# Patient Record
Sex: Male | Born: 1990 | Hispanic: Yes | Marital: Single | Smoking: Never smoker
Health system: Southern US, Community
[De-identification: ages and names within clinical notes are randomized; demographics above are authoritative.]

## PROBLEM LIST (undated history)

## (undated) DIAGNOSIS — Q179 Congenital malformation of ear, unspecified: Secondary | ICD-10-CM

---

## 2010-07-25 ENCOUNTER — Emergency Department (HOSPITAL_COMMUNITY)
Admission: EM | Admit: 2010-07-25 | Discharge: 2010-07-26 | Disposition: A | Discharge status: Home / Self Care | Payer: Self-pay | Attending: Emergency Medicine | Admitting: Emergency Medicine

## 2010-07-25 ENCOUNTER — Emergency Department (HOSPITAL_COMMUNITY): Payer: Self-pay

## 2010-07-25 DIAGNOSIS — S62639A Displaced fracture of distal phalanx of unspecified finger, initial encounter for closed fracture: Secondary | ICD-10-CM | POA: Insufficient documentation

## 2010-07-25 DIAGNOSIS — Y92009 Unspecified place in unspecified non-institutional (private) residence as the place of occurrence of the external cause: Secondary | ICD-10-CM | POA: Insufficient documentation

## 2010-07-25 DIAGNOSIS — M79609 Pain in unspecified limb: Secondary | ICD-10-CM | POA: Insufficient documentation

## 2010-07-25 DIAGNOSIS — S61209A Unspecified open wound of unspecified finger without damage to nail, initial encounter: Secondary | ICD-10-CM | POA: Insufficient documentation

## 2010-07-25 DIAGNOSIS — W298XXA Contact with other powered powered hand tools and household machinery, initial encounter: Secondary | ICD-10-CM | POA: Insufficient documentation

## 2011-11-02 IMAGING — CR DG FINGER RING 2+V*L*
3 series · 3 of 3 positions shown · non-contrast
Comparison: None.

CLINICAL DATA: Drill through third and fourth digits distally.

LEFT RING FINGER 2+V

[x finger pa left]
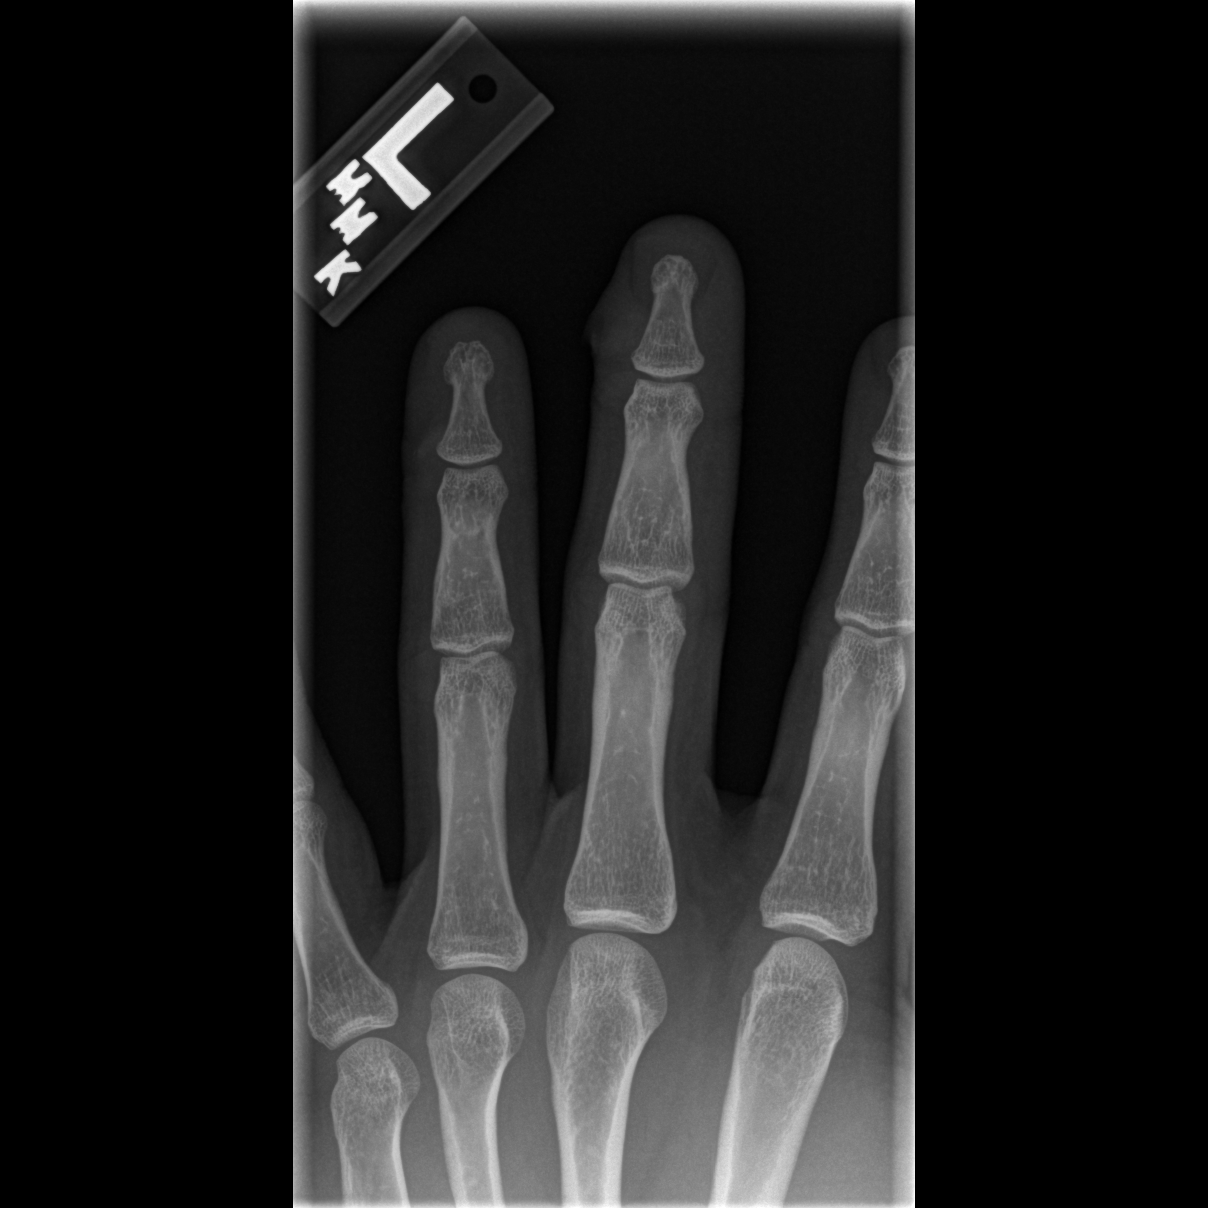

[x finger obl. left]
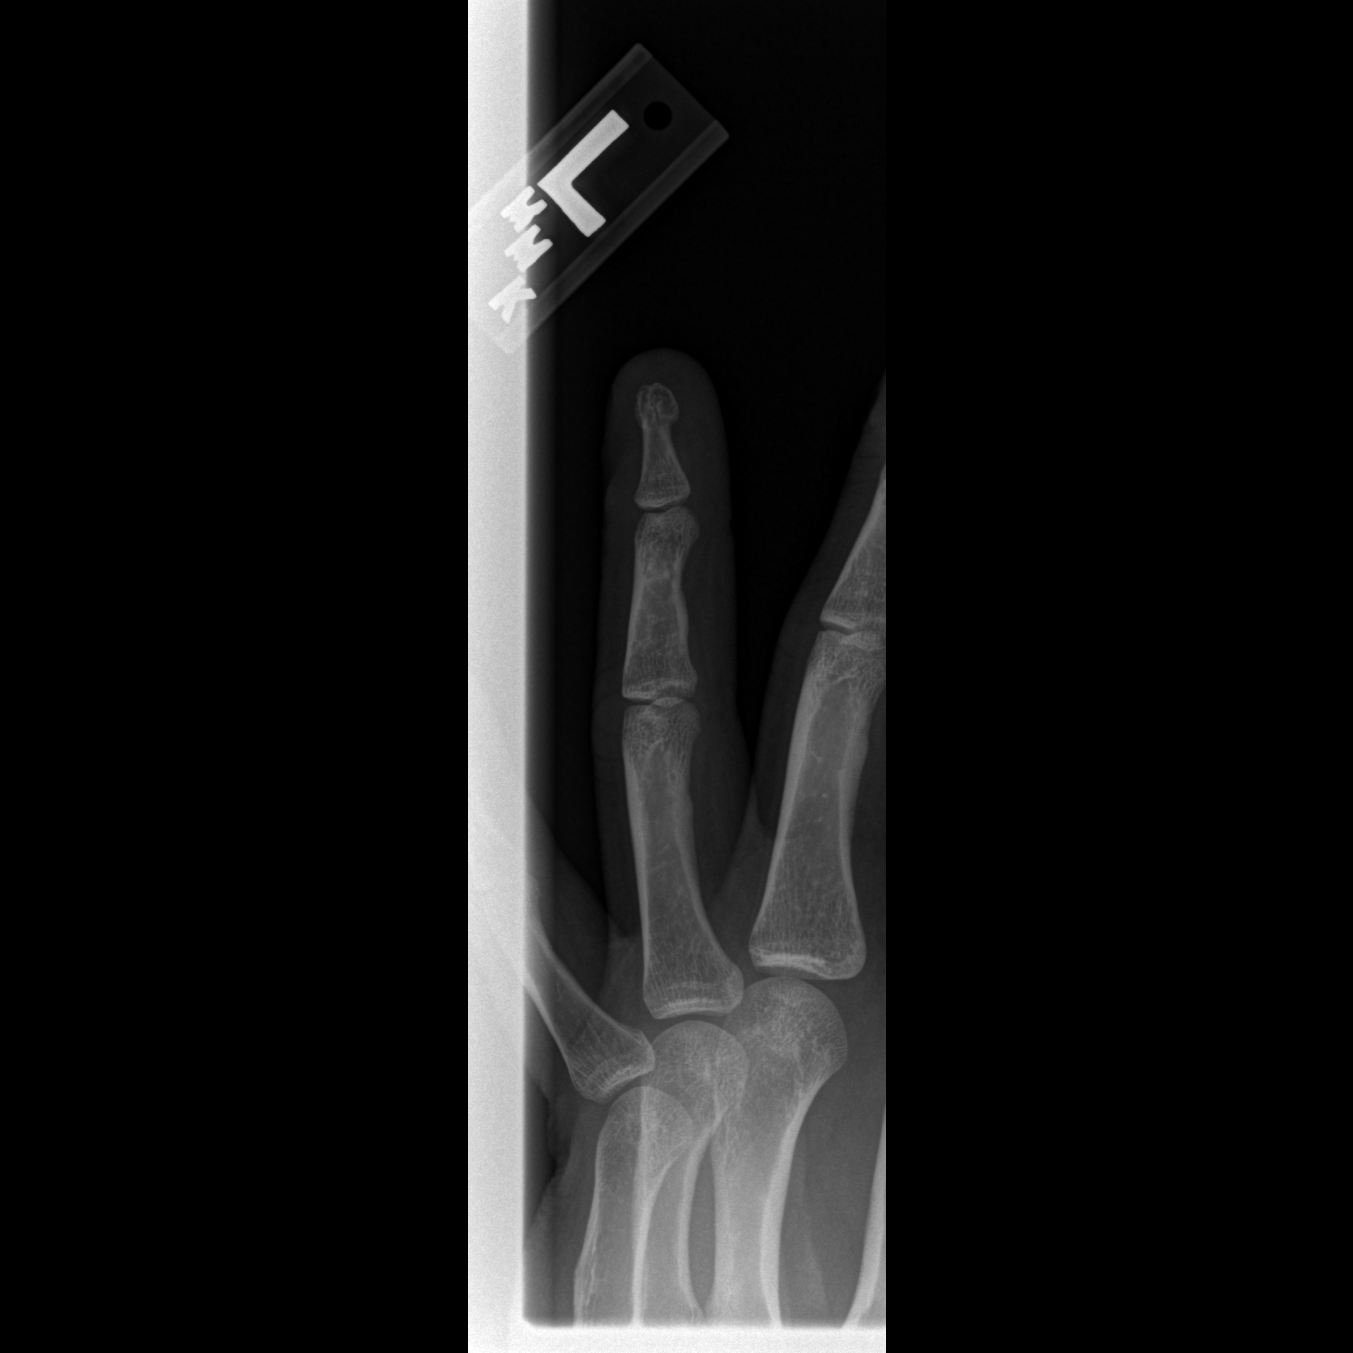

[x finger lateral left]
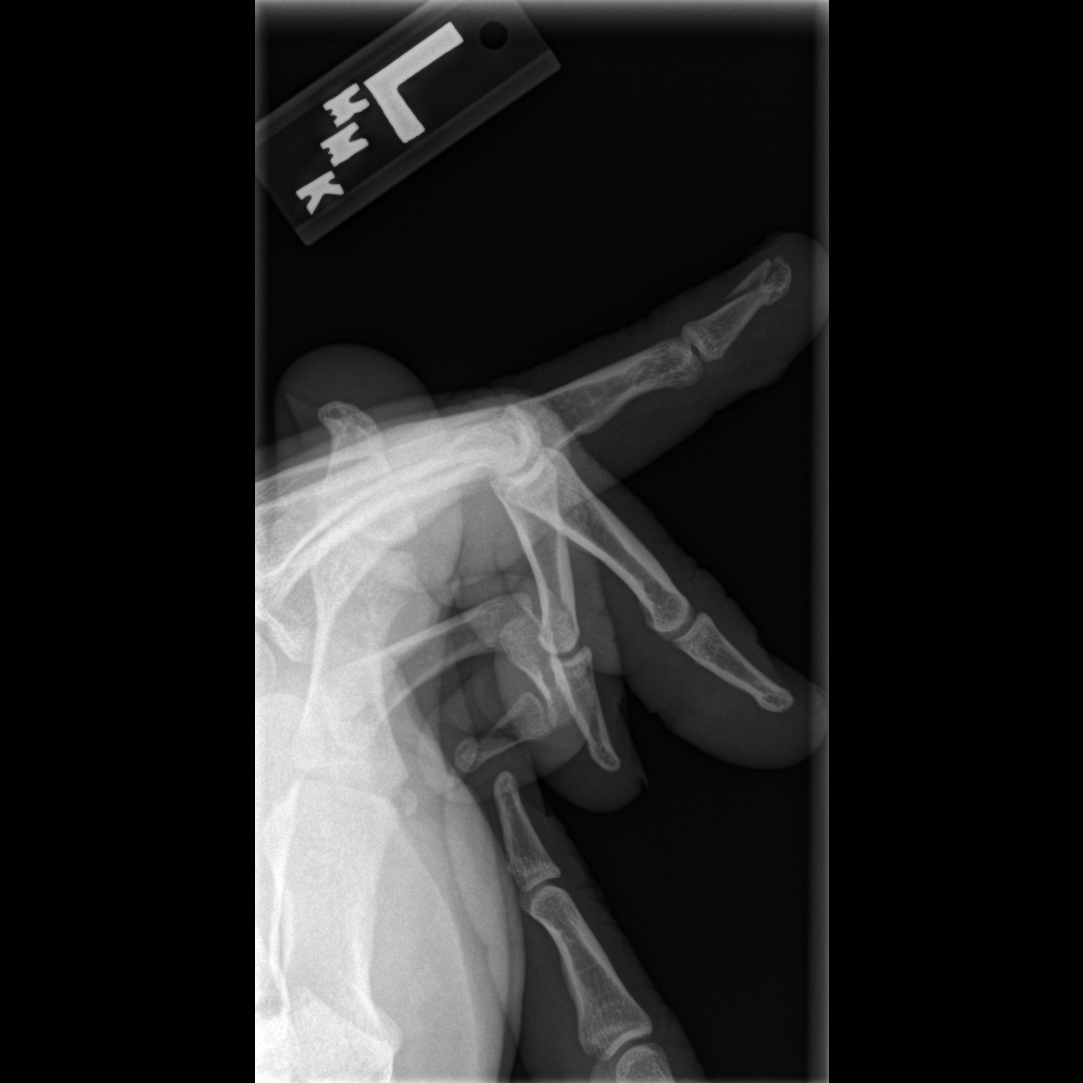

[3 of 3 positions shown; findings below may reference images not displayed]

FINDINGS: 3 views centered about the ring finger of the left hand.
Soft tissue injury is identified about the ulnar side of the third
digit distally.  No radiopaque foreign object.  A fracture is
identified about the tuft of the fourth digit posteriorly.  The
fracture is primarily in the coronal plane with minimal
comminution.  No intra-articular extension.
IMPRESSION: Tuft fracture of the fourth digit with adjacent soft tissue
irregularity.  Mild comminution.

## 2021-10-01 ENCOUNTER — Inpatient Hospital Stay (HOSPITAL_COMMUNITY)
Admission: EM | Admit: 2021-10-01 | Discharge: 2021-10-06 | DRG: 683 | Disposition: A | Discharge status: Home / Self Care | Payer: Self-pay | Attending: Internal Medicine | Admitting: Internal Medicine

## 2021-10-01 ENCOUNTER — Emergency Department (HOSPITAL_COMMUNITY): Payer: Self-pay

## 2021-10-01 ENCOUNTER — Encounter (HOSPITAL_COMMUNITY): Payer: Self-pay | Admitting: Internal Medicine

## 2021-10-01 ENCOUNTER — Other Ambulatory Visit: Payer: Self-pay

## 2021-10-01 DIAGNOSIS — Z6826 Body mass index (BMI) 26.0-26.9, adult: Secondary | ICD-10-CM

## 2021-10-01 DIAGNOSIS — Z20822 Contact with and (suspected) exposure to covid-19: Secondary | ICD-10-CM | POA: Diagnosis present

## 2021-10-01 DIAGNOSIS — E872 Acidosis, unspecified: Secondary | ICD-10-CM | POA: Diagnosis present

## 2021-10-01 DIAGNOSIS — R739 Hyperglycemia, unspecified: Secondary | ICD-10-CM

## 2021-10-01 DIAGNOSIS — A419 Sepsis, unspecified organism: Secondary | ICD-10-CM

## 2021-10-01 DIAGNOSIS — E1165 Type 2 diabetes mellitus with hyperglycemia: Secondary | ICD-10-CM | POA: Diagnosis present

## 2021-10-01 DIAGNOSIS — E876 Hypokalemia: Secondary | ICD-10-CM | POA: Diagnosis present

## 2021-10-01 DIAGNOSIS — E8770 Fluid overload, unspecified: Secondary | ICD-10-CM | POA: Diagnosis not present

## 2021-10-01 DIAGNOSIS — K760 Fatty (change of) liver, not elsewhere classified: Secondary | ICD-10-CM | POA: Diagnosis present

## 2021-10-01 DIAGNOSIS — R7401 Elevation of levels of liver transaminase levels: Secondary | ICD-10-CM

## 2021-10-01 DIAGNOSIS — N179 Acute kidney failure, unspecified: Principal | ICD-10-CM

## 2021-10-01 DIAGNOSIS — E785 Hyperlipidemia, unspecified: Secondary | ICD-10-CM | POA: Diagnosis present

## 2021-10-01 DIAGNOSIS — F1010 Alcohol abuse, uncomplicated: Secondary | ICD-10-CM | POA: Diagnosis present

## 2021-10-01 DIAGNOSIS — E871 Hypo-osmolality and hyponatremia: Secondary | ICD-10-CM

## 2021-10-01 DIAGNOSIS — R1310 Dysphagia, unspecified: Secondary | ICD-10-CM | POA: Diagnosis not present

## 2021-10-01 DIAGNOSIS — D649 Anemia, unspecified: Secondary | ICD-10-CM | POA: Diagnosis present

## 2021-10-01 DIAGNOSIS — E861 Hypovolemia: Secondary | ICD-10-CM | POA: Diagnosis present

## 2021-10-01 DIAGNOSIS — E860 Dehydration: Secondary | ICD-10-CM | POA: Diagnosis present

## 2021-10-01 DIAGNOSIS — R6520 Severe sepsis without septic shock: Secondary | ICD-10-CM | POA: Diagnosis present

## 2021-10-01 DIAGNOSIS — E8729 Other acidosis: Secondary | ICD-10-CM | POA: Diagnosis present

## 2021-10-01 DIAGNOSIS — E669 Obesity, unspecified: Secondary | ICD-10-CM | POA: Diagnosis present

## 2021-10-01 DIAGNOSIS — R7989 Other specified abnormal findings of blood chemistry: Secondary | ICD-10-CM

## 2021-10-01 DIAGNOSIS — D696 Thrombocytopenia, unspecified: Secondary | ICD-10-CM | POA: Diagnosis present

## 2021-10-01 HISTORY — DX: Congenital malformation of ear, unspecified: Q17.9

## 2021-10-01 LAB — COMPREHENSIVE METABOLIC PANEL
ALT: 64 U/L — ABNORMAL HIGH (ref 0–44)
AST: 62 U/L — ABNORMAL HIGH (ref 15–41)
Albumin: 2.8 g/dL — ABNORMAL LOW (ref 3.5–5.0)
Alkaline Phosphatase: 67 U/L (ref 38–126)
Anion gap: 17 — ABNORMAL HIGH (ref 5–15)
BUN: 37 mg/dL — ABNORMAL HIGH (ref 6–20)
CO2: 18 mmol/L — ABNORMAL LOW (ref 22–32)
Calcium: 6.8 mg/dL — ABNORMAL LOW (ref 8.9–10.3)
Chloride: 79 mmol/L — ABNORMAL LOW (ref 98–111)
Creatinine, Ser: 1.52 mg/dL — ABNORMAL HIGH (ref 0.61–1.24)
GFR, Estimated: >60 mL/min (ref >60)
Glucose, Bld: 318 mg/dL — ABNORMAL HIGH (ref 70–99)
Potassium: 3.2 mmol/L — ABNORMAL LOW (ref 3.5–5.1)
Sodium: 114 mmol/L — CL (ref 135–145)
Total Bilirubin: 11.7 mg/dL — ABNORMAL HIGH (ref 0.3–1.2)
Total Protein: 6.5 g/dL (ref 6.5–8.1)

## 2021-10-01 LAB — URINALYSIS, ROUTINE W REFLEX MICROSCOPIC
Bilirubin Urine: NEGATIVE
Glucose, UA: 150 mg/dL — AB
Ketones, ur: NEGATIVE mg/dL
Nitrite: NEGATIVE
Protein, ur: NEGATIVE mg/dL
Specific Gravity, Urine: 1.012 (ref 1.005–1.030)
pH: 5 (ref 5.0–8.0)

## 2021-10-01 LAB — CBG MONITORING, ED
Glucose-Capillary: 328 mg/dL — ABNORMAL HIGH (ref 70–99)
Glucose-Capillary: 284 mg/dL — ABNORMAL HIGH (ref 70–99)

## 2021-10-01 LAB — CBC WITH DIFFERENTIAL/PLATELET
Abs Immature Granulocytes: 0.06 10*3/uL (ref 0.00–0.07)
Basophils Absolute: 0 10*3/uL (ref 0.0–0.1)
Basophils Relative: 0 %
Eosinophils Absolute: 0 10*3/uL (ref 0.0–0.5)
Eosinophils Relative: 0 %
HCT: 37.8 % — ABNORMAL LOW (ref 39.0–52.0)
Hemoglobin: 13.9 g/dL (ref 13.0–17.0)
Immature Granulocytes: 0 %
Lymphocytes Relative: 1 %
Lymphs Abs: 0.2 10*3/uL — ABNORMAL LOW (ref 0.7–4.0)
MCH: 27.7 pg (ref 26.0–34.0)
MCHC: 36.8 g/dL — ABNORMAL HIGH (ref 30.0–36.0)
MCV: 75.3 fL — ABNORMAL LOW (ref 80.0–100.0)
Monocytes Absolute: 0.3 10*3/uL (ref 0.1–1.0)
Monocytes Relative: 2 %
Neutro Abs: 14.6 10*3/uL — ABNORMAL HIGH (ref 1.7–7.7)
Neutrophils Relative %: 97 %
Platelets: UNDETERMINED 10*3/uL (ref 150–400)
RBC: 5.02 MIL/uL (ref 4.22–5.81)
RDW: 12.4 % (ref 11.5–15.5)
WBC: 15.3 10*3/uL — ABNORMAL HIGH (ref 4.0–10.5)
nRBC: 0.1 % (ref 0.0–0.2)

## 2021-10-01 LAB — BLOOD GAS, VENOUS
Acid-base deficit: 6.3 mmol/L — ABNORMAL HIGH (ref 0.0–2.0)
Bicarbonate: 18.1 mmol/L — ABNORMAL LOW (ref 20.0–28.0)
Drawn by: 54030
O2 Saturation: 76 %
Patient temperature: 37
pCO2, Ven: 32 mmHg — ABNORMAL LOW (ref 44–60)
pH, Ven: 7.36 (ref 7.25–7.43)
pO2, Ven: 44 mmHg (ref 32–45)

## 2021-10-01 LAB — LACTIC ACID, PLASMA
Lactic Acid, Venous: 4.3 mmol/L (ref 0.5–1.9)
Lactic Acid, Venous: 3.6 mmol/L (ref 0.5–1.9)

## 2021-10-01 LAB — PROTIME-INR
INR: 1.4 — ABNORMAL HIGH (ref 0.8–1.2)
Prothrombin Time: 16.7 seconds — ABNORMAL HIGH (ref 11.4–15.2)

## 2021-10-01 LAB — LIPASE, BLOOD: Lipase: 63 U/L — ABNORMAL HIGH (ref 11–51)

## 2021-10-01 LAB — BETA-HYDROXYBUTYRIC ACID: Beta-Hydroxybutyric Acid: 0.15 mmol/L (ref 0.05–0.27)

## 2021-10-01 MED ORDER — POTASSIUM CHLORIDE CRYS ER 20 MEQ PO TBCR
40.0000 meq | EXTENDED_RELEASE_TABLET | Freq: Once | ORAL | Status: AC
  Administered 2021-10-01: 40 meq via ORAL
  Filled 2021-10-01: qty 2

## 2021-10-01 MED ORDER — ACETAMINOPHEN 325 MG PO TABS
650.0000 mg | ORAL_TABLET | Freq: Four times a day (QID) | ORAL | Status: DC | PRN
  Administered 2021-10-02 (×2): 650 mg via ORAL
  Filled 2021-10-01 (×2): qty 2

## 2021-10-01 MED ORDER — ONDANSETRON 4 MG PO TBDP: 4.0000 mg | ORAL_TABLET | Freq: Once | ORAL | Status: AC

## 2021-10-01 MED ORDER — ONDANSETRON 4 MG PO TBDP
ORAL_TABLET | ORAL | Status: AC
  Administered 2021-10-01: 4 mg via ORAL
  Filled 2021-10-01: qty 1

## 2021-10-01 MED ORDER — INSULIN ASPART 100 UNIT/ML IJ SOLN
0.0000 [IU] | INTRAMUSCULAR | Status: DC
  Administered 2021-10-02 (×2): 3 [IU] via SUBCUTANEOUS
  Administered 2021-10-02 (×2): 2 [IU] via SUBCUTANEOUS
  Administered 2021-10-02 (×2): 3 [IU] via SUBCUTANEOUS
  Administered 2021-10-03: 2 [IU] via SUBCUTANEOUS
  Administered 2021-10-03: 3 [IU] via SUBCUTANEOUS
  Administered 2021-10-03 (×3): 2 [IU] via SUBCUTANEOUS
  Administered 2021-10-03: 3 [IU] via SUBCUTANEOUS
  Administered 2021-10-04: 1 [IU] via SUBCUTANEOUS
  Administered 2021-10-04 (×3): 2 [IU] via SUBCUTANEOUS
  Administered 2021-10-04: 3 [IU] via SUBCUTANEOUS
  Administered 2021-10-04: 2 [IU] via SUBCUTANEOUS
  Administered 2021-10-05: 1 [IU] via SUBCUTANEOUS
  Administered 2021-10-05: 2 [IU] via SUBCUTANEOUS
  Administered 2021-10-06: 1 [IU] via SUBCUTANEOUS

## 2021-10-01 MED ORDER — SODIUM CHLORIDE 0.9 % IV BOLUS (SEPSIS)
1000.0000 mL | Freq: Once | INTRAVENOUS | Status: AC
  Administered 2021-10-01: 1000 mL via INTRAVENOUS

## 2021-10-01 MED ORDER — SODIUM CHLORIDE 0.9 % IV SOLN: INTRAVENOUS | Status: DC

## 2021-10-01 MED ORDER — SODIUM CHLORIDE 0.9 % IV SOLN
2.0000 g | Freq: Once | INTRAVENOUS | Status: AC
  Administered 2021-10-01: 2 g via INTRAVENOUS
  Filled 2021-10-01: qty 12.5

## 2021-10-01 MED ORDER — SODIUM CHLORIDE 0.9 % IV BOLUS
1000.0000 mL | Freq: Once | INTRAVENOUS | Status: AC
  Administered 2021-10-01: 1000 mL via INTRAVENOUS

## 2021-10-01 MED ORDER — VANCOMYCIN HCL IN DEXTROSE 1-5 GM/200ML-% IV SOLN: 1000.0000 mg | Freq: Once | INTRAVENOUS | Status: DC

## 2021-10-01 MED ORDER — DEXTROSE-NACL 5-0.45 % IV SOLN: INTRAVENOUS | Status: DC

## 2021-10-01 MED ORDER — POTASSIUM CHLORIDE 10 MEQ/100ML IV SOLN
10.0000 meq | INTRAVENOUS | Status: AC
  Administered 2021-10-01 – 2021-10-02 (×4): 10 meq via INTRAVENOUS
  Filled 2021-10-01 (×4): qty 100

## 2021-10-01 MED ORDER — METRONIDAZOLE 500 MG/100ML IV SOLN
500.0000 mg | Freq: Once | INTRAVENOUS | Status: AC
  Administered 2021-10-01: 500 mg via INTRAVENOUS
  Filled 2021-10-01: qty 100

## 2021-10-01 MED ORDER — SODIUM CHLORIDE 0.9 % IV BOLUS (SEPSIS)
500.0000 mL | Freq: Once | INTRAVENOUS | Status: AC
  Administered 2021-10-01: 500 mL via INTRAVENOUS

## 2021-10-01 MED ORDER — SODIUM CHLORIDE 0.9 % IV SOLN
2.0000 g | Freq: Three times a day (TID) | INTRAVENOUS | Status: DC
  Administered 2021-10-02 – 2021-10-04 (×7): 2 g via INTRAVENOUS
  Filled 2021-10-01 (×6): qty 12.5

## 2021-10-01 MED ORDER — ACETAMINOPHEN 325 MG PO TABS
ORAL_TABLET | ORAL | Status: AC
  Administered 2021-10-01: 650 mg via ORAL
  Filled 2021-10-01: qty 2

## 2021-10-01 MED ORDER — ONDANSETRON HCL 4 MG/2ML IJ SOLN
4.0000 mg | Freq: Four times a day (QID) | INTRAMUSCULAR | Status: DC | PRN
  Administered 2021-10-02: 4 mg via INTRAVENOUS
  Filled 2021-10-01: qty 2

## 2021-10-01 MED ORDER — LACTATED RINGERS IV SOLN: INTRAVENOUS | Status: DC

## 2021-10-01 MED ORDER — METRONIDAZOLE 500 MG/100ML IV SOLN
500.0000 mg | Freq: Two times a day (BID) | INTRAVENOUS | Status: DC
  Administered 2021-10-02 – 2021-10-03 (×4): 500 mg via INTRAVENOUS
  Filled 2021-10-01 (×4): qty 100

## 2021-10-01 MED ORDER — ACETAMINOPHEN 650 MG RE SUPP: 650.0000 mg | Freq: Four times a day (QID) | RECTAL | Status: DC | PRN

## 2021-10-01 MED ORDER — VANCOMYCIN HCL 1750 MG/350ML IV SOLN
1750.0000 mg | Freq: Once | INTRAVENOUS | Status: DC
  Filled 2021-10-01: qty 350

## 2021-10-01 MED ORDER — INSULIN REGULAR(HUMAN) IN NACL 100-0.9 UT/100ML-% IV SOLN
INTRAVENOUS | Status: DC
  Administered 2021-10-01: 5.5 [IU]/h via INTRAVENOUS
  Filled 2021-10-01: qty 100

## 2021-10-01 MED ORDER — PANTOPRAZOLE SODIUM 40 MG IV SOLR
40.0000 mg | INTRAVENOUS | Status: DC
  Administered 2021-10-02 – 2021-10-03 (×2): 40 mg via INTRAVENOUS
  Filled 2021-10-01 (×2): qty 10

## 2021-10-01 MED ORDER — IOHEXOL 300 MG/ML  SOLN
100.0000 mL | Freq: Once | INTRAMUSCULAR | Status: AC | PRN
  Administered 2021-10-01: 100 mL via INTRAVENOUS

## 2021-10-01 MED ORDER — ACETAMINOPHEN 325 MG PO TABS: 650.0000 mg | ORAL_TABLET | Freq: Once | ORAL | Status: AC

## 2021-10-01 MED ORDER — DEXTROSE 50 % IV SOLN: 0.0000 mL | INTRAVENOUS | Status: DC | PRN

## 2021-10-01 MED ORDER — VANCOMYCIN HCL 1750 MG/350ML IV SOLN
1750.0000 mg | INTRAVENOUS | Status: DC
  Administered 2021-10-01: 1750 mg via INTRAVENOUS
  Filled 2021-10-01: qty 350

## 2021-10-01 NOTE — ED Notes (Signed)
Pt to CT scan.

## 2021-10-01 NOTE — Progress Notes (Signed)
Pharmacy Antibiotic Note  Tom Merritt is a 31 y.o. male for which pharmacy has been consulted for cefepime and vancomycin dosing for sepsis.  Patient presenting with N/V/D, SOB, cough.  SCr 1.52 - unk baseline WBC 15.3; LA 4.3; T 99.6 F; HR 125; RR 41  Plan: Metronidazole per MD Cefepime 2g q8hr Vancomycin 1750 mg q24hr (eAUC 478) unless change in renal function - may be in AKI - f.u AM SCr closely Trend WBC, Fever, Renal function, & Clinical course F/u cultures, clinical course, WBC, fever De-escalate when able  Height: 5\' 9"  (175.3 cm) Weight: 81.6 kg (180 lb) IBW/kg (Calculated) : 70.7  Temp (24hrs), Avg:100.3 F (37.9 C), Min:99.6 F (37.6 C), Max:100.9 F (38.3 C)  Recent Labs  Lab 10/01/21 1745  WBC 15.3*  CREATININE 1.52*  LATICACIDVEN 4.3*    Estimated Creatinine Clearance: 69.8 mL/min (A) (by C-G formula based on SCr of 1.52 mg/dL (H)).    No Known Allergies  Antimicrobials this admission: cefepime 7/2 >>  metronidazole 7/2 >>  vancomycin 7/2 >>   Microbiology results: Pending  Thank you for allowing pharmacy to be a part of this patient's care.  9/2, PharmD, BCPS 10/01/2021 7:42 PM ED Clinical Pharmacist -  [phone]

## 2021-10-01 NOTE — ED Provider Notes (Addendum)
[facility] EMERGENCY DEPARTMENT Provider Note   CSN: [id number] Arrival date & time: 10/01/21  1727     History  Chief Complaint  Patient presents with   Emesis   Fever    Tom Merritt is a 31 y.o. male with no provided medical history.  Patient presents to the ED for evaluation.  Patient states that beginning on Friday he began experiencing nausea, vomiting, diarrhea.  Patient states he was also having fevers at home.  Patient reports that he had blood in his vomit, he has thrown up many times and is unable to tell me how many times.  The patient denies any blood in stool, abdominal pain, chest pain or shortness of breath.  Patient reports that he got a new tattoo on Monday on his right upper arm.  Patient's father adds that the patient has been having diarrhea at night, sometimes while he is asleep. When asked about alcohol history patient will not give direct answer. When the patient was asked if he drinks everyday he said "sometimes every other day 3-5 beers". Patient denies any medical history to include diabetes, CKD, CAD.    Emesis Associated symptoms: diarrhea and fever   Associated symptoms: no abdominal pain   Fever Associated symptoms: diarrhea, nausea and vomiting   Associated symptoms: no chest pain        Home Medications Prior to Admission medications   Not on File      Allergies    Patient has no known allergies.    Review of Systems   Review of Systems  Constitutional:  Positive for fever.  Respiratory:  Negative for shortness of breath.   Cardiovascular:  Negative for chest pain.  Gastrointestinal:  Positive for diarrhea, nausea and vomiting. Negative for abdominal pain and blood in stool.  All other systems reviewed and are negative.   Physical Exam Updated Vital Signs BP 114/64   Pulse (!) 124   Temp (!) 100.6 F (38.1 C) (Oral)   Resp (!) 47   Ht 5\' 9"  (1.753 m)   Wt 81.6 kg   SpO2 93%   BMI 26.58 kg/m  Physical  Exam Vitals and nursing note reviewed.  Constitutional:      General: He is not in acute distress.    Appearance: Normal appearance. He is ill-appearing, toxic-appearing and diaphoretic.  HENT:     Head: Normocephalic and atraumatic.     Nose: Nose normal. No congestion.     Mouth/Throat:     Mouth: Mucous membranes are moist.     Pharynx: Oropharynx is clear.  Eyes:     General: Scleral icterus present.     Extraocular Movements: Extraocular movements intact.     Conjunctiva/sclera: Conjunctivae normal.     Pupils: Pupils are equal, round, and reactive to light.  Cardiovascular:     Rate and Rhythm: Regular rhythm. Tachycardia present.  Pulmonary:     Effort: Pulmonary effort is normal.     Breath sounds: Normal breath sounds. No wheezing.  Abdominal:     General: Abdomen is flat. There is no distension.     Palpations: There is no mass.     Tenderness: There is no abdominal tenderness. There is no guarding or rebound.     Hernia: No hernia is present.  Musculoskeletal:     Cervical back: Normal range of motion and neck supple. No tenderness.  Skin:    General: Skin is warm.     Capillary Refill: Capillary refill takes  less than 2 seconds.  Neurological:     Mental Status: He is alert and oriented to person, place, and time.     ED Results / Procedures / Treatments   Labs (all labs ordered are listed, but only abnormal results are displayed) Labs Reviewed  COMPREHENSIVE METABOLIC PANEL - Abnormal; Notable for the following components:      Result Value   Sodium 114 (*)    Potassium 3.2 (*)    Chloride 79 (*)    CO2 18 (*)    Glucose, Bld 318 (*)    BUN 37 (*)    Creatinine, Ser 1.52 (*)    Calcium 6.8 (*)    Albumin 2.8 (*)    AST 62 (*)    ALT 64 (*)    Total Bilirubin 11.7 (*)    Anion gap 17 (*)    All other components within normal limits  LACTIC ACID, PLASMA - Abnormal; Notable for the following components:   Lactic Acid, Venous 4.3 (*)    All other  components within normal limits  LACTIC ACID, PLASMA - Abnormal; Notable for the following components:   Lactic Acid, Venous 3.6 (*)    All other components within normal limits  CBC WITH DIFFERENTIAL/PLATELET - Abnormal; Notable for the following components:   WBC 15.3 (*)    HCT 37.8 (*)    MCV 75.3 (*)    MCHC 36.8 (*)    Neutro Abs 14.6 (*)    Lymphs Abs 0.2 (*)    All other components within normal limits  PROTIME-INR - Abnormal; Notable for the following components:   Prothrombin Time 16.7 (*)    INR 1.4 (*)    All other components within normal limits  URINALYSIS, ROUTINE W REFLEX MICROSCOPIC - Abnormal; Notable for the following components:   Color, Urine AMBER (*)    APPearance CLOUDY (*)    Glucose, UA 150 (*)    Hgb urine dipstick MODERATE (*)    Leukocytes,Ua TRACE (*)    Bacteria, UA FEW (*)    Non Squamous Epithelial 0-5 (*)    All other components within normal limits  LIPASE, BLOOD - Abnormal; Notable for the following components:   Lipase 63 (*)    All other components within normal limits  BLOOD GAS, VENOUS - Abnormal; Notable for the following components:   pCO2, Ven 32 (*)    Bicarbonate 18.1 (*)    Acid-base deficit 6.3 (*)    All other components within normal limits  BASIC METABOLIC PANEL - Abnormal; Notable for the following components:   Sodium 117 (*)    Potassium 3.1 (*)    Chloride 86 (*)    CO2 16 (*)    Glucose, Bld 268 (*)    BUN 36 (*)    Creatinine, Ser 1.54 (*)    Calcium 5.8 (*)    All other components within normal limits  MAGNESIUM - Abnormal; Notable for the following components:   Magnesium 1.0 (*)    All other components within normal limits  HEMOGLOBIN A1C - Abnormal; Notable for the following components:   Hgb A1c MFr Bld 10.6 (*)    All other components within normal limits  LACTIC ACID, PLASMA - Abnormal; Notable for the following components:   Lactic Acid, Venous 3.8 (*)    All other components within normal limits   BILIRUBIN, DIRECT - Abnormal; Notable for the following components:   Bilirubin, Direct 7.2 (*)    All other components  within normal limits  CK - Abnormal; Notable for the following components:   Total CK 1,196 (*)    All other components within normal limits  ACETAMINOPHEN LEVEL - Abnormal; Notable for the following components:   Acetaminophen (Tylenol), Serum <10 (*)    All other components within normal limits  OSMOLALITY - Abnormal; Notable for the following components:   Osmolality 271 (*)    All other components within normal limits  CBG MONITORING, ED - Abnormal; Notable for the following components:   Glucose-Capillary 328 (*)    All other components within normal limits  CBG MONITORING, ED - Abnormal; Notable for the following components:   Glucose-Capillary 284 (*)    All other components within normal limits  CBG MONITORING, ED - Abnormal; Notable for the following components:   Glucose-Capillary 262 (*)    All other components within normal limits  CBG MONITORING, ED - Abnormal; Notable for the following components:   Glucose-Capillary 228 (*)    All other components within normal limits  SARS CORONAVIRUS 2 BY RT PCR  CULTURE, BLOOD (ROUTINE X 2)  CULTURE, BLOOD (ROUTINE X 2)  GASTROINTESTINAL PANEL BY PCR, STOOL (REPLACES STOOL CULTURE)  C DIFFICILE QUICK SCREEN W PCR REFLEX    BETA-HYDROXYBUTYRIC ACID  HEPATITIS PANEL, ACUTE  HIV ANTIBODY (ROUTINE TESTING W REFLEX)  PROCALCITONIN  RAPID URINE DRUG SCREEN, HOSP PERFORMED  ETHANOL  SODIUM, URINE, RANDOM  CREATININE, URINE, RANDOM  TSH  OSMOLALITY, URINE  CBC WITH DIFFERENTIAL/PLATELET  COMPREHENSIVE METABOLIC PANEL  MAGNESIUM  PROTIME-INR  LACTIC ACID, PLASMA  BASIC METABOLIC PANEL  LACTATE DEHYDROGENASE  APTT  I-STAT VENOUS BLOOD GAS, ED    EKG EKG Interpretation  Date/Time:  Sunday October 01 2021 17:37:00 EDT Ventricular Rate:  147 PR Interval:  126 QRS Duration: 78 QT Interval:  344 QTC  Calculation: 538 R Axis:   87 Text Interpretation: Sinus tachycardia Non-specific ST-t changes No previous ECGs available Confirmed by Cathren Laine ([id number]) on 10/01/2021 5:44:16 PM  Radiology CT ABDOMEN PELVIS W CONTRAST  Result Date: 10/01/2021 CLINICAL DATA:  Acute abdominal pain with nausea and vomiting for several days, initial encounter EXAM: CT ABDOMEN AND PELVIS WITH CONTRAST TECHNIQUE: Multidetector CT imaging of the abdomen and pelvis was performed using the standard protocol following bolus administration of intravenous contrast. RADIATION DOSE REDUCTION: This exam was performed according to the departmental dose-optimization program which includes automated exposure control, adjustment of the mA and/or kV according to patient size and/or use of iterative reconstruction technique. CONTRAST:  OMNIPAQUE IOHEXOL 300 MG/ML  SOLN COMPARISON:  None Available. FINDINGS: Lower chest: Mild dependent atelectatic changes are noted. No focal infiltrate is seen. Hepatobiliary: Fatty liver is noted.  Gallbladder is decompressed. Pancreas: Unremarkable. No pancreatic ductal dilatation or surrounding inflammatory changes. Spleen: Normal in size without focal abnormality. Adrenals/Urinary Tract: Adrenal glands are within normal limits. Kidneys demonstrate a normal enhancement pattern. No renal calculi or obstructive changes are noted. Ureters are within normal limits. The bladder is well distended. Stomach/Bowel: No obstructive or inflammatory changes of the colon are seen. The appendix is within normal limits. Small bowel and stomach are unremarkable. Vascular/Lymphatic: No significant vascular findings are present. No enlarged abdominal or pelvic lymph nodes. Reproductive: Prostate is unremarkable. Other: No abdominal wall hernia or abnormality. No abdominopelvic ascites. Musculoskeletal: No acute or significant osseous findings. IMPRESSION: Fatty liver. No acute abnormality is identified to correspond with  the given clinical history. Electronically Signed   By: Eulah Pont.D.  On: 10/01/2021 21:36   DG Chest Portable 1 View  Result Date: 10/01/2021 CLINICAL DATA:  Shortness of breath.  Fever and vomiting. EXAM: PORTABLE CHEST 1 VIEW COMPARISON:  None Available. FINDINGS: Low lung volumes and soft tissue attenuation from habitus limits assessment. Heart size is normal for technique. No focal airspace disease, pleural effusion, pneumothorax or pulmonary edema. No acute osseous abnormalities are seen. IMPRESSION: Low lung volumes without acute abnormality. Electronically Signed   By: Keith Rake M.D.   On: 10/01/2021 18:24    Procedures .Critical Care  Performed by: Azucena Cecil, PA-C Authorized by: Azucena Cecil, PA-C   Critical care provider statement:    Critical care time (minutes):  95   Critical care time was exclusive of:  Separately billable procedures and treating other patients and teaching time   Critical care was necessary to treat or prevent imminent or life-threatening deterioration of the following conditions:  Sepsis and renal failure   Critical care was time spent personally by me on the following activities:  Blood draw for specimens, ordering and performing treatments and interventions, development of treatment plan with patient or surrogate, discussions with consultants, discussions with primary provider, evaluation of patient's response to treatment, examination of patient, interpretation of cardiac output measurements, obtaining history from patient or surrogate, vascular access procedures, review of old charts, re-evaluation of patient's condition, pulse oximetry, ordering and review of radiographic studies and ordering and review of laboratory studies   Care discussed with: admitting provider       Medications Ordered in ED Medications  ceFEPIme (MAXIPIME) 2 g in sodium chloride 0.9 % 100 mL IVPB (has no administration in time range)  vancomycin  (VANCOREADY) IVPB 1750 mg/350 mL (0 mg Intravenous Stopped 10/02/21 0008)  dextrose 50 % solution 0-50 mL (has no administration in time range)  0.9 %  sodium chloride infusion ( Intravenous New Bag/Given 10/01/21 2311)  potassium chloride 10 mEq in 100 mL IVPB (10 mEq Intravenous New Bag/Given 10/02/21 0231)  acetaminophen (TYLENOL) tablet 650 mg (650 mg Oral Given 10/02/21 0137)    Or  acetaminophen (TYLENOL) suppository 650 mg ( Rectal See Alternative 10/02/21 0137)  ondansetron (ZOFRAN) injection 4 mg (4 mg Intravenous Given 10/02/21 0135)  metroNIDAZOLE (FLAGYL) IVPB 500 mg (has no administration in time range)  insulin aspart (novoLOG) injection 0-9 Units (3 Units Subcutaneous Given 10/02/21 0105)  pantoprazole (PROTONIX) injection 40 mg (40 mg Intravenous Given 10/02/21 0013)  ondansetron (ZOFRAN-ODT) disintegrating tablet 4 mg (4 mg Oral Given 10/01/21 1753)  acetaminophen (TYLENOL) tablet 650 mg (650 mg Oral Given 10/01/21 1754)  sodium chloride 0.9 % bolus 1,000 mL (0 mLs Intravenous Stopped 10/01/21 2140)  potassium chloride SA (KLOR-CON M) CR tablet 40 mEq (40 mEq Oral Given 10/01/21 2147)  ceFEPIme (MAXIPIME) 2 g in sodium chloride 0.9 % 100 mL IVPB (0 g Intravenous Stopped 10/01/21 2140)  metroNIDAZOLE (FLAGYL) IVPB 500 mg (0 mg Intravenous Stopped 10/01/21 2140)  sodium chloride 0.9 % bolus 1,000 mL (0 mLs Intravenous Stopped 10/01/21 2255)    And  sodium chloride 0.9 % bolus 1,000 mL (0 mLs Intravenous Stopped 10/01/21 2140)    And  sodium chloride 0.9 % bolus 500 mL (0 mLs Intravenous Stopped 10/01/21 2255)  iohexol (OMNIPAQUE) 300 MG/ML solution 100 mL (100 mLs Intravenous Contrast Given 10/01/21 2123)    ED Course/ Medical Decision Making/ A&P  Medical Decision Making Amount and/or Complexity of Data Reviewed Labs: ordered. Radiology: ordered.  Risk OTC drugs. Prescription drug management. Decision regarding hospitalization.   31 year old male presents to the ED for  evaluation.  Please see HPI for further details.  On examination, the patient is tachycardic and febrile.  Patient lung sounds are clear bilaterally, he is not hypoxic on room air.  The patient abdomen is soft and compressible in all 4 quadrants, there is no overlying skin change about the abdomen, there is no hepatomegaly, there is no splenomegaly.  Patient neurological examination shows no focal neurodeficits.  Patient follows commands appropriately.  The patient is toxic appearing.  Patient worked up utilizing the following labs and imaging studies interpreted by me personally: - Patient lactic acid elevated at 4.3, on reassessment it is decreased to 3.6 - Urinalysis shows moderate hemoglobin in urine, glucose in urine, trace leukocytes - Lipase elevated at 63 - CBC has leukocytosis of 15.3, no decreased hemoglobin. - CMP shows decreased sodium to 114 which is a critical finding. The patient was slowly given normal saline. Decreased potassium to 3.2, elevated glucose of 318, elevated creatinine of 1.52, elevated bilirubin of 11.7, elevated anion gap to 17.  The patient denies any history of diabetes. Patient abdominal exam benign. Patient does have slight scleral icterus bilaterally. Patient CT scan unrevealing of any abnormalities to liver or pancreas. - Prothrombin time 16.7 - INR 1.4 - Beta hydroxybutyrate acid 0.15 - Blood cultures pending - Patient chest x-ray shows no consolidation, effusion, cardiomegaly. - Patient CT scan shows fatty liver however no other abnormalities are noted.  There is no pancreatic ductal dilatation, pseudocyst.  There is no cirrhosis of the liver. The patient denies history of DM, will not give clear answer on alcohol intake.   Medications provided: 40 mEq potassium, normal saline boluses, Tylenol for fever, Zofran for nausea. IV potassium repletion.   Due to the fact this patient has leukocytosis, is tachycardic and febrile we have started this patient on broad  spectrum antibiotics.  These antibiotics include cefepime, metronidazole, vancomycin.  At this time, source of patient presentation unknown.  Hepatitis panel has been collected. The patient will require hospitalization and admission to further assess cause.  Hospitalist, Dr. Velia Meyer, was consulted for admission.  Dr. Velia Meyer has agreed to admit the patient for further management.  The patient is amenable to the plan.  The patient is stable at time of admission.  Final Clinical Impression(s) / ED Diagnoses Final diagnoses:  AKI (acute kidney injury) ([place])  Sepsis with acute liver failure without septic shock, due to unspecified organism, unspecified whether hepatic coma present ([place])  Hyperglycemia  Hyponatremia  Transaminitis    Rx / DC Orders ED Discharge Orders     None             Fredia Sorrow, MD 10/01/21 2357    Azucena Cecil, PA-C 10/02/21 0258    Fredia Sorrow, MD 10/09/21 1615

## 2021-10-01 NOTE — ED Triage Notes (Signed)
Pt arrives from home for N/V/D, fevers, w/ shob and productive cough x3 days. Pt denies pain just doesn't feel well. Last took tylenol yesterday, is unable to tolerate anything PO. Pt just got a new tattoo on R upper arm 4 days ago that is warm to touch and red.

## 2021-10-01 NOTE — Sepsis Progress Note (Signed)
[facility] following for Sepsis Protocol 

## 2021-10-01 NOTE — H&P (Addendum)
History and Physical    PLEASE NOTE THAT DRAGON DICTATION SOFTWARE WAS USED IN THE CONSTRUCTION OF THIS NOTE.   Tom Merritt PNT:[id number] DOB: 1990/07/22 DOA: 10/01/2021  PCP: Pcp, No  (will further assess) Patient coming from: home   I have personally briefly reviewed patient's old medical records in [place]  Chief Complaint: Nausea, vomiting, diarrhea  HPI: Tom Merritt is a 31 y.o. male with no reported significant medical history who is admitted to [facility] on 10/01/2021 with severe sepsis of unclear source after presenting from home to [facility] ED complaining of nausea, vomiting, diarrhea.   The patient reports 3 to 4 days of intermittent nausea resulting in 3-5 episodes per day of nonbilious emesis over that timeframe, with last such episode occurring just prior to presenting to [facility] emergency department this evening.  With 1 episode of vomiting earlier in the day, he noted some blood tinge to see with his vomitus, but notes that he is subsequently vomited, without any appearance of bright red blood or coffee-ground appearance.  He denies any recent melena or hematochezia he also denies any recent abdominal discomfort.  Over similar timeframe, he notes 2-3 daily episodes of watery, loose stool.  In the setting of his intermittent nausea/vomiting over the last few days, he notes very little oral intake over that timeframe.  This has been associated with fever in the absence of full body rigors, chills, or generalized myalgias.  Denies any associated recent headache, neck stiffness, sore throat, shortness of breath, cough, or rash.Denies any recent dysuria or gross hematuria.  No chest pain, diaphoresis, palpitations.  He conveys that he had a new tattoo to the right biceps on Monday, 09/25/2021.  He notes that he has been caring for the new tattoo with the recommended topical agents, and has not noted any associated surrounding erythema or swelling to this area.    Denies any history of recreational drug use, including no history of IV drug abuse.  He conveys that his alcohol consumption habits consist of 3 to 4 twelve ounce beers every other day, with out any significant recent increase from this baseline alcohol consumption.   Denies any known past medical history, including no history of underlying diabetes.     ED Course:  Vital signs in the ED were notable for the following: Temperature max 100.9; initial heart rates in the 130s to the low 140s, associated sinus tachycardia, with subsequent decrease in heart rate to the 1 teens following interval IV fluids, as further quantified below; initial blood pressure 99/67, subsequent improvement to 117/72 following IV fluids; respiratory rate 20-28, oxygen saturation 93 to 95% on room air.  Labs were notable for the following: Initial CBG noted to be 328.  CMP notable for the following: Sodium 114, which corrects to approximately 117 when taking into account concomitant hyperglycemia, potassium 3.2 chloride 79, bicarbonate 18, anion gap 17, BUN 37, creatinine 1.52, without any prior creatinine data point available for point comparison, BUN/creatinine ratio 24.3, glucose 318, calcium, corrected for mild hypoalbuminemia noted to be 7.8, albumin 2.8, alkaline phosphatase 67, AST 62, ALT 64, total bilirubin 11.7.  Lipase 63.  Initial lactate 4.3, with repeat value trending down to 3.6 following interval IV fluids, as quantified below.  Beta-hydroxybutyrate acid 0.15.  CBC notable for the following: Will with cell count 15,300 with 97% neutrophils, hemoglobin 13.9.  INR 1.4 urinalysis was sissy with an amber appearing specimen and notable for 6-10 white blood cells, 6-10 squamous epithelial  cells, nitrate negative, moderate hemoglobin in the absence of any red blood cells, ketone negative, and showed the presence of hyaline cast.  Blood cultures x2 were collected prior to initiation of broad-spectrum IV antibiotics.   The following studies have been ordered, with results currently pending: HIV, acute viral hepatitis panel, VBG.   Imaging and additional notable ED work-up: EKG shows sinus tachycardia with heart rate 140, no evidence of T wave or ST changes, including no evidence of ST elevation.  Chest x-ray showed low lung volumes, but otherwise demonstrated no evidence of acute cardiopulmonary process, no evidence of infiltrate, edema, effusion, or pneumothorax.  CT abdomen/pelvis with contrast showed evidence of fatty liver, but otherwise showed no evidence of acute intra-abdominal or acute intrapelvic process, including no evidence of gallbladder wall thickening, cholecystic fluid, gallstones, choledocholithiasis, common bile duct dilation, nor any evidence of acute pancreatitis, nor any evidence of ureteral stone or hydronephrosis  While in the ED, the following were administered: Acetaminophen 650 mg p.o. x1, Zofran 4 mg ODT x1, normal saline x3.5 L bolus followed by continuous NS at 125 cc/h, potassium chloride 40 mEq p.o. x1, with additional order for potassium chloride 40 mEq IV every 4 hours, and insulin drip was initiated by EDP due to initial concern for DKA.   Subsequently, the patient was admitted for further evaluation and management of severe sepsis of unclear source in the setting of presenting nausea, vomiting, diarrhea, with presenting labs also notable for anion gap metabolic acidosis, lactic acidosis, acute kidney injury, acute transaminitis with hyperbilirubinemia in the absence of cholestatic pattern, hyperglycemia, hypokalemia acute hyponatremia.     Review of Systems: As per HPI otherwise 10 point review of systems negative.   History reviewed. No pertinent past medical history.  History reviewed. No pertinent surgical history.  Social History:  reports that he has never smoked. He has never used smokeless tobacco. He reports current alcohol use. He reports that he does not use  drugs.   No Known Allergies  History reviewed. No pertinent family history.   Outpatient medications: The patient denies taking any prescription or over-the-counter medications or supplements as an outpatient.    Objective    Physical Exam: Vitals:   10/01/21 2130 10/01/21 2145 10/01/21 2200 10/01/21 2215  BP:  (!) 113/59 117/72 117/72  Pulse: (!) 115 (!) 113 (!) 113 (!) 115  Resp: (!) 27 (!) 39 (!) 45 (!) 47  Temp:      TempSrc:      SpO2: 94% 92% 93% 93%  Weight:      Height:        General: appears to be stated age; alert, oriented Skin: warm, dry, no rash Head:  AT/[place] Mouth:  Oral mucosa membranes appear dry, normal dentition Neck: supple; trachea midline Heart: Tachycardic, but regular; did not appreciate any M/R/G Lungs: CTAB, did not appreciate any wheezes, rales, or rhonchi Abdomen: + BS; soft, ND, NT Vascular: 2+ pedal pulses b/l; 2+ radial pulses b/l Extremities: no peripheral edema, no muscle wasting Neuro: strength and sensation intact in upper and lower extremities b/l     Labs on Admission: I have personally reviewed following labs and imaging studies  CBC: Recent Labs  Lab 10/01/21 1745  WBC 15.3*  NEUTROABS 14.6*  HGB 13.9  HCT 37.8*  MCV 75.3*  PLT PLATELET CLUMPS NOTED ON SMEAR, UNABLE TO ESTIMATE   Basic Metabolic Panel: Recent Labs  Lab 10/01/21 1745  NA 114*  K 3.2*  CL 79*  CO2  18*  GLUCOSE 318*  BUN 37*  CREATININE 1.52*  CALCIUM 6.8*   GFR: Estimated Creatinine Clearance: 69.8 mL/min (A) (by C-G formula based on SCr of 1.52 mg/dL (H)). Liver Function Tests: Recent Labs  Lab 10/01/21 1745  AST 62*  ALT 64*  ALKPHOS 67  BILITOT 11.7*  PROT 6.5  ALBUMIN 2.8*   Recent Labs  Lab 10/01/21 1745  LIPASE 63*   No results for input(s): "AMMONIA" in the last 168 hours. Coagulation Profile: Recent Labs  Lab 10/01/21 1745  INR 1.4*   Cardiac Enzymes: No results for input(s): "CKTOTAL", "CKMB", "CKMBINDEX",  "TROPONINI" in the last 168 hours. BNP (last 3 results) No results for input(s): "PROBNP" in the last 8760 hours. HbA1C: No results for input(s): "HGBA1C" in the last 72 hours. CBG: Recent Labs  Lab 10/01/21 2028 10/01/21 2306  GLUCAP 328* 284*   Lipid Profile: No results for input(s): "CHOL", "HDL", "LDLCALC", "TRIG", "CHOLHDL", "LDLDIRECT" in the last 72 hours. Thyroid Function Tests: No results for input(s): "TSH", "T4TOTAL", "FREET4", "T3FREE", "THYROIDAB" in the last 72 hours. Anemia Panel: No results for input(s): "VITAMINB12", "FOLATE", "FERRITIN", "TIBC", "IRON", "RETICCTPCT" in the last 72 hours. Urine analysis:    Component Value Date/Time   COLORURINE AMBER (A) 10/01/2021 2029   APPEARANCEUR CLOUDY (A) 10/01/2021 2029   LABSPEC 1.012 10/01/2021 2029   PHURINE 5.0 10/01/2021 2029   GLUCOSEU 150 (A) 10/01/2021 2029   HGBUR MODERATE (A) 10/01/2021 2029   BILIRUBINUR NEGATIVE 10/01/2021 2029   KETONESUR NEGATIVE 10/01/2021 2029   PROTEINUR NEGATIVE 10/01/2021 2029   NITRITE NEGATIVE 10/01/2021 2029   LEUKOCYTESUR TRACE (A) 10/01/2021 2029    Radiological Exams on Admission: CT ABDOMEN PELVIS W CONTRAST  Result Date: 10/01/2021 CLINICAL DATA:  Acute abdominal pain with nausea and vomiting for several days, initial encounter EXAM: CT ABDOMEN AND PELVIS WITH CONTRAST TECHNIQUE: Multidetector CT imaging of the abdomen and pelvis was performed using the standard protocol following bolus administration of intravenous contrast. RADIATION DOSE REDUCTION: This exam was performed according to the departmental dose-optimization program which includes automated exposure control, adjustment of the mA and/or kV according to patient size and/or use of iterative reconstruction technique. CONTRAST:  131mL OMNIPAQUE IOHEXOL 300 MG/ML  SOLN COMPARISON:  None Available. FINDINGS: Lower chest: Mild dependent atelectatic changes are noted. No focal infiltrate is seen. Hepatobiliary: Fatty liver  is noted.  Gallbladder is decompressed. Pancreas: Unremarkable. No pancreatic ductal dilatation or surrounding inflammatory changes. Spleen: Normal in size without focal abnormality. Adrenals/Urinary Tract: Adrenal glands are within normal limits. Kidneys demonstrate a normal enhancement pattern. No renal calculi or obstructive changes are noted. Ureters are within normal limits. The bladder is well distended. Stomach/Bowel: No obstructive or inflammatory changes of the colon are seen. The appendix is within normal limits. Small bowel and stomach are unremarkable. Vascular/Lymphatic: No significant vascular findings are present. No enlarged abdominal or pelvic lymph nodes. Reproductive: Prostate is unremarkable. Other: No abdominal wall hernia or abnormality. No abdominopelvic ascites. Musculoskeletal: No acute or significant osseous findings. IMPRESSION: Fatty liver. No acute abnormality is identified to correspond with the given clinical history. Electronically Signed   By: Inez Catalina M.D.   On: 10/01/2021 21:36   DG Chest Portable 1 View  Result Date: 10/01/2021 CLINICAL DATA:  Shortness of breath.  Fever and vomiting. EXAM: PORTABLE CHEST 1 VIEW COMPARISON:  None Available. FINDINGS: Low lung volumes and soft tissue attenuation from habitus limits assessment. Heart size is normal for technique. No focal airspace disease, pleural  effusion, pneumothorax or pulmonary edema. No acute osseous abnormalities are seen. IMPRESSION: Low lung volumes without acute abnormality. Electronically Signed   By: Keith Rake M.D.   On: 10/01/2021 18:24     EKG: Independently reviewed, with result as described above.    Assessment/Plan   Principal Problem:   Severe sepsis (HCC) Active Problems:   High anion gap metabolic acidosis   Lactic acidosis   AKI (acute kidney injury) (HCC)   Transaminitis   Hyperglycemia   Hypokalemia   Acute hyponatremia      #) Severe sepsis: Criteria met for sepsis via  presenting objective fever, tachycardia, tachypnea, leukocytosis.  Source not entirely clear at this time, although there is some suspicion for gastroenteritis given presenting nausea, vomiting, diarrhea, although CT abdomen/pelvis shows no evidence of inflammatory bowel changes or any other acute intra-abdominal/acute intrapelvic process.  will send stool studies to further evaluate, as further detailed below.   Lactic acid level: Initially noted to be 4.3, with repeat value during that 3.6. Of note, given the associated presence of suspected end organ damage in the form of concominant presenting elevated lactate as well as acute kidney injury, criteria are met for pt's sepsis to be considered severe in nature.  He has received a 3.5 L NS bolus, which equates to greater than 30 mL/kg IVF bolus.  We will continue to trend in setting blood gas levels, as below.  In terms of other considered infectious sources, the patient denies any acute respiratory symptoms, and chest x-ray shows no evidence of acute cardiopulmonary process, including no evidence of infiltrate or edema, effusion, or pneumothorax.  Urinalysis not consistent with UTI.  No rash.  Given concomitant transaminitis with GI symptoms, will also check COVID-19 PCR.  Hepatitis screen result pending at this time.  Acute viral hepatitis panel also pending.  In the setting of objective fever, nausea, vomiting, hyponatremia, also considered in differential the possibility of meningitis, however the patient denies any associated neck stiffness, thereby decreasing index of suspicion for this.  Of note, he has already received IV vancomycin, cefepime, Flagyl in the ED, therefore therapeutic no associated benefit from inclusion of corticosteroids at this time.will refrain from pursuit of LP at this time, while closely monitoring ensuing clinical trend.   Of note, cultures x2 were collected prior to initiation of the above IV antibiotics, which also be continued  for now, including IV Flagyl for provision of anaerobic coverage given potential for enteric infectious source.    Plan: CBC w/ diff and CMP in AM.  Follow for results of blood cx's x 2. Abx: Continue IV vancomycin, cefepime, Flagyl, as above.  Repeat lactic acid level at 1 AM.  Continue existing normal saline at 125 cc/h.  Add on procalcitonin level check COVID-19 PCR.  Follow for results of acute viral hepatitis panel as well as HIV screen.  Stool studies in the form of GI panel by PCR as well as C. Difficile screen.  Prn IV Zofran.  Refraining from antimotility agents for now pending results of stool studies.         #) Anion gap metabolic acidosis: Likely multifactorial in nature, with contributions from presenting lactic acidosis in the setting of suspected severe sepsis, along with independent contribution from acute kidney injury.  will also continue to trend INR to evaluate hepatic synthetic function.  Differential also includes the possibility of rhabdomyolysis in the setting of lactic acidosis as well as urinalysis that demonstrates moderate blood without any evidence of RBCs,  raising the possibility of myoglobinuria. Will add on CPK level to assess.  Of note, while there was an initial consideration for potential DKA, the presence of nonelevated beta hydroxybutyric acid as well as the absence of ketones renders this much less likely.  Consequently, will discontinue insulin drip at this time, as further detailed below.  Given report of routine alcohol consumption at least every 48 hours, differential includes the possibility of alcoholic lactic acidosis.   Plan.  Continuous IV fluids, as above.  Repeat lactate at 1 AM.  Further evaluation management of AKI, as above.  Add on CPK level.  Repeat INR in the morning.  Add on serum ethanol level.  Check urinary drug screen.  Repeat CMP in the morning.  Add on acetaminophen level.  Follow for results of VBG.         #) Acute kidney injury:  Presenting serum creatinine noted to be 1.52, presumed represent acute kidney injury in the setting of prerenal influences from dehydration as result of increased GI losses and diminished oral intake over the last several days, although no prior serum creatinine data point available for point comparison.  Presenting labs also demonstrate evidence of prerenal azotemia consistent with the above.  Urinalysis with microscopy notable for the presence hyaline cast, consistent with a picture of dehydration.  Also evaluate for rhabdomyolysis, as further detailed below.  Plan: Continuous IV fluids, as above.  Monitor strict I's and O's and daily weights.  Tempt avoid nephrotoxic agents.  Add on CPK level.  Add on random urine sodium as well as random urine creatinine.  Repeat CMP in the morning.         #) Acute transaminitis: Mildly elevated AST/ALT along with total bilirubin of 11.7, without any elevation of alkaline phosphatase, appearing consistent with cholestatic pattern, while CT abdomen/pelvis with IV contrast shows no evidence of acute gallbladder pathology or any evidence of biliary obstruction nor any evidence of acute pancreatitis.  Evidence of fatty liver observed on CT scan, as above.  Differential includes acute alcoholic hepatitis versus viral process, particularly in the setting of, nausea, vomiting, diarrhea.  Of note acute viral hepatitis panel currently pending.  Also checking for COVID.  Also considered the possibility of chronic mild elevation AST ALT in the setting of the patient's chronic alcohol consumption with the possibility, although less likely, of a hemolytic process to account for the elevated bilirubin level.  Check direct bilirubin at this time to further assess.  If indirect predominant, would consider pursuit of peripheral smear.  May also consider repeat quadrant ultrasound depending upon ensuing clinical trend as well as trending in setting transaminases per updated CMP in the  morning. Of note, there would be a contraindication to initiation of systemic steroids for acute alcoholic hepatitis in the setting of concomitant acute kidney injury.  Plan: Add on direct bilirubin.  Repeat CMP in the morning.  Follow for results of acute viral otitis panel as well as HIV screen.  Check COVID-19 PCR.  Repeat INR in the morning and check PTT.  Check LDH.  Repeat CBC in the morning.  Add on acetaminophen level.  Urinary drug screen.  Add on serum ethanol level.         #) Hyperglycemia: In the context of no known history of underlying diabetes, patient found to be mildly hyperglycemic, with initial blood sugars in the low 300s, subsequent proving into the 200s following IV fluids and initiation of insulin drip.  Will assess for previously undiagnosed  diabetes hemoglobin A1c level.  As noted above, in the absence of elevation of beta hydroxybutyric acid in the absence of any ketones, presentation appears less suggestive of DKA.  We will therefore discontinue insulin drip at this time, closely monitoring blood sugar via serial CBG monitoring along with associated sliding scale insulin.  Likely also a contraction from dehydration along with contribution from facility stress in the setting of presenting severe sepsis.  CT abdomen/pelvis shows no evidence of acute pancreatitis, lipase not elevated.  Plan: Discontinue insulin drip at this time.  Every 4 hours CBG monitoring along with sliding scale insulin.  Add on hemoglobin A1c level.  IV fluids, as above.  Repeat CMP in the morning.          #) Hypokalemia: Present serum potassium level noted to be 3.2.  He has received 40 mEq of oral potassium chloride in the ED along with an existing order for 40 additional milliequivalents of IV potassium chloride.  Suspect contributions from recent GI losses from nausea, vomiting, diarrhea, as above.  Plan: Continue existing potassium supplementation, with repeat CMP ordered for the morning.   Add on serum magnesium level.  Monitor on telemetry.         #) Acute hypovolemic hypoosmolar hyponatremia: Presenting serum sodium level noted to be 117, corrected for concomitant hyperglycemia, as further detailed above.  Presumed to be acute in nature, although no prior serum sodium data points available for point comparison.  Suspect that this is hypovolemic in nature in the setting of recent increase in GI losses, as above.  We will continue existing IV fluids, closely monitor ensuing serum sodium trend as outlined below.   Plan: Continuous NS, as above.  Repeat CMP in the morning.  BMP ordered for 9 AM on 10/02/2020.  Monitor strict I's and O's and daily weights.  Add on Premeal insulin as well as random urine osmolality.  We will also check serum osmolality to confirm suspected hypoosmolar etiology.  Check TSH.      DVT prophylaxis: SCD's   Code Status: Full code Disposition Plan: Per Rounding Team Consults called: none;  Admission status: inpatient;     PLEASE NOTE THAT DRAGON DICTATION SOFTWARE WAS USED IN THE CONSTRUCTION OF THIS NOTE.   [place] DO [facility]  From [place]

## 2021-10-02 ENCOUNTER — Inpatient Hospital Stay (HOSPITAL_COMMUNITY): Payer: Self-pay

## 2021-10-02 DIAGNOSIS — E876 Hypokalemia: Secondary | ICD-10-CM | POA: Diagnosis present

## 2021-10-02 DIAGNOSIS — R7401 Elevation of levels of liver transaminase levels: Secondary | ICD-10-CM | POA: Diagnosis present

## 2021-10-02 DIAGNOSIS — N179 Acute kidney failure, unspecified: Principal | ICD-10-CM | POA: Diagnosis present

## 2021-10-02 DIAGNOSIS — E871 Hypo-osmolality and hyponatremia: Secondary | ICD-10-CM | POA: Diagnosis present

## 2021-10-02 DIAGNOSIS — R739 Hyperglycemia, unspecified: Secondary | ICD-10-CM | POA: Diagnosis present

## 2021-10-02 DIAGNOSIS — E872 Acidosis, unspecified: Secondary | ICD-10-CM | POA: Diagnosis present

## 2021-10-02 DIAGNOSIS — E8729 Other acidosis: Secondary | ICD-10-CM | POA: Diagnosis present

## 2021-10-02 LAB — COMPREHENSIVE METABOLIC PANEL
ALT: 54 U/L — ABNORMAL HIGH (ref 0–44)
AST: 49 U/L — ABNORMAL HIGH (ref 15–41)
Albumin: 2.2 g/dL — ABNORMAL LOW (ref 3.5–5.0)
Alkaline Phosphatase: 67 U/L (ref 38–126)
Anion gap: 16 — ABNORMAL HIGH (ref 5–15)
BUN: 31 mg/dL — ABNORMAL HIGH (ref 6–20)
CO2: 16 mmol/L — ABNORMAL LOW (ref 22–32)
Calcium: 6.3 mg/dL — CL (ref 8.9–10.3)
Chloride: 92 mmol/L — ABNORMAL LOW (ref 98–111)
Creatinine, Ser: 1.43 mg/dL — ABNORMAL HIGH (ref 0.61–1.24)
GFR, Estimated: >60 mL/min (ref >60)
Glucose, Bld: 221 mg/dL — ABNORMAL HIGH (ref 70–99)
Potassium: 3.4 mmol/L — ABNORMAL LOW (ref 3.5–5.1)
Sodium: 124 mmol/L — ABNORMAL LOW (ref 135–145)
Total Bilirubin: 10.9 mg/dL — ABNORMAL HIGH (ref 0.3–1.2)
Total Protein: 5.4 g/dL — ABNORMAL LOW (ref 6.5–8.1)

## 2021-10-02 LAB — CBG MONITORING, ED
Glucose-Capillary: 185 mg/dL — ABNORMAL HIGH (ref 70–99)
Glucose-Capillary: 201 mg/dL — ABNORMAL HIGH (ref 70–99)
Glucose-Capillary: 205 mg/dL — ABNORMAL HIGH (ref 70–99)
Glucose-Capillary: 228 mg/dL — ABNORMAL HIGH (ref 70–99)
Glucose-Capillary: 262 mg/dL — ABNORMAL HIGH (ref 70–99)

## 2021-10-02 LAB — GLUCOSE, CAPILLARY
Glucose-Capillary: 226 mg/dL — ABNORMAL HIGH (ref 70–99)
Glucose-Capillary: 188 mg/dL — ABNORMAL HIGH (ref 70–99)

## 2021-10-02 LAB — RAPID URINE DRUG SCREEN, HOSP PERFORMED
Amphetamines: NOT DETECTED
Barbiturates: NOT DETECTED
Benzodiazepines: NOT DETECTED
Cocaine: NOT DETECTED
Opiates: NOT DETECTED
Tetrahydrocannabinol: NOT DETECTED

## 2021-10-02 LAB — BASIC METABOLIC PANEL
Anion gap: 15 (ref 5–15)
Anion gap: 14 (ref 5–15)
BUN: 36 mg/dL — ABNORMAL HIGH (ref 6–20)
BUN: 33 mg/dL — ABNORMAL HIGH (ref 6–20)
CO2: 16 mmol/L — ABNORMAL LOW (ref 22–32)
CO2: 15 mmol/L — ABNORMAL LOW (ref 22–32)
Calcium: 5.8 mg/dL — CL (ref 8.9–10.3)
Calcium: 6.5 mg/dL — ABNORMAL LOW (ref 8.9–10.3)
Chloride: 86 mmol/L — ABNORMAL LOW (ref 98–111)
Chloride: 97 mmol/L — ABNORMAL LOW (ref 98–111)
Creatinine, Ser: 1.54 mg/dL — ABNORMAL HIGH (ref 0.61–1.24)
Creatinine, Ser: 1.62 mg/dL — ABNORMAL HIGH (ref 0.61–1.24)
GFR, Estimated: >60 mL/min (ref >60)
GFR, Estimated: 57 mL/min — ABNORMAL LOW (ref >60)
Glucose, Bld: 268 mg/dL — ABNORMAL HIGH (ref 70–99)
Glucose, Bld: 171 mg/dL — ABNORMAL HIGH (ref 70–99)
Potassium: 3.1 mmol/L — ABNORMAL LOW (ref 3.5–5.1)
Potassium: 3.5 mmol/L (ref 3.5–5.1)
Sodium: 117 mmol/L — CL (ref 135–145)
Sodium: 126 mmol/L — ABNORMAL LOW (ref 135–145)

## 2021-10-02 LAB — C DIFFICILE QUICK SCREEN W PCR REFLEX
C Diff antigen: NEGATIVE
C Diff interpretation: NOT DETECTED
C Diff toxin: NEGATIVE

## 2021-10-02 LAB — GASTROINTESTINAL PANEL BY PCR, STOOL (REPLACES STOOL CULTURE)

## 2021-10-02 LAB — HEPATITIS PANEL, ACUTE
HCV Ab: NONREACTIVE
Hep A IgM: NONREACTIVE
Hep B C IgM: NONREACTIVE
Hepatitis B Surface Ag: NONREACTIVE

## 2021-10-02 LAB — CBC WITH DIFFERENTIAL/PLATELET
Abs Immature Granulocytes: 0.04 10*3/uL (ref 0.00–0.07)
Basophils Absolute: 0.1 10*3/uL (ref 0.0–0.1)
Basophils Relative: 1 %
Eosinophils Absolute: 0 10*3/uL (ref 0.0–0.5)
Eosinophils Relative: 0 %
HCT: 35 % — ABNORMAL LOW (ref 39.0–52.0)
Hemoglobin: 12.7 g/dL — ABNORMAL LOW (ref 13.0–17.0)
Immature Granulocytes: 0 %
Lymphocytes Relative: 1 %
Lymphs Abs: 0.1 10*3/uL — ABNORMAL LOW (ref 0.7–4.0)
MCH: 27.5 pg (ref 26.0–34.0)
MCHC: 36.3 g/dL — ABNORMAL HIGH (ref 30.0–36.0)
MCV: 75.8 fL — ABNORMAL LOW (ref 80.0–100.0)
Monocytes Absolute: 0.3 10*3/uL (ref 0.1–1.0)
Monocytes Relative: 3 %
Neutro Abs: 10.6 10*3/uL — ABNORMAL HIGH (ref 1.7–7.7)
Neutrophils Relative %: 95 %
Platelets: 41 10*3/uL — ABNORMAL LOW (ref 150–400)
RBC: 4.62 MIL/uL (ref 4.22–5.81)
RDW: 12.5 % (ref 11.5–15.5)
Smear Review: NORMAL
WBC: 11.1 10*3/uL — ABNORMAL HIGH (ref 4.0–10.5)
nRBC: 0 % (ref 0.0–0.2)

## 2021-10-02 LAB — HEMOGLOBIN A1C
Hgb A1c MFr Bld: 10.6 % — ABNORMAL HIGH (ref 4.8–5.6)
Mean Plasma Glucose: 257.52 mg/dL

## 2021-10-02 LAB — BILIRUBIN, DIRECT: Bilirubin, Direct: 7.2 mg/dL — ABNORMAL HIGH (ref 0.0–0.2)

## 2021-10-02 LAB — BLOOD GAS, ARTERIAL
Acid-base deficit: 7.6 mmol/L — ABNORMAL HIGH (ref 0.0–2.0)
Bicarbonate: 15.2 mmol/L — ABNORMAL LOW (ref 20.0–28.0)
Drawn by: 31996
O2 Saturation: 97.7 %
Patient temperature: 37.2
pCO2 arterial: 24 mmHg — ABNORMAL LOW (ref 32–48)
pH, Arterial: 7.41 (ref 7.35–7.45)
pO2, Arterial: 80 mmHg — ABNORMAL LOW (ref 83–108)

## 2021-10-02 LAB — CK: Total CK: 1196 U/L — ABNORMAL HIGH (ref 49–397)

## 2021-10-02 LAB — PROTIME-INR
INR: 1.4 — ABNORMAL HIGH (ref 0.8–1.2)
Prothrombin Time: 16.6 seconds — ABNORMAL HIGH (ref 11.4–15.2)

## 2021-10-02 LAB — MAGNESIUM
Magnesium: 1 mg/dL — ABNORMAL LOW (ref 1.7–2.4)
Magnesium: 1.1 mg/dL — ABNORMAL LOW (ref 1.7–2.4)

## 2021-10-02 LAB — OSMOLALITY, URINE: Osmolality, Ur: 342 mOsm/kg (ref 300–900)

## 2021-10-02 LAB — LACTIC ACID, PLASMA
Lactic Acid, Venous: 3.8 mmol/L (ref 0.5–1.9)
Lactic Acid, Venous: 3.4 mmol/L (ref 0.5–1.9)

## 2021-10-02 LAB — HIV ANTIBODY (ROUTINE TESTING W REFLEX): HIV Screen 4th Generation wRfx: NONREACTIVE

## 2021-10-02 LAB — SODIUM, URINE, RANDOM: Sodium, Ur: <10 mmol/L

## 2021-10-02 LAB — APTT: aPTT: 32 seconds (ref 24–36)

## 2021-10-02 LAB — OSMOLALITY: Osmolality: 271 mOsm/kg — ABNORMAL LOW (ref 275–295)

## 2021-10-02 LAB — ACETAMINOPHEN LEVEL: Acetaminophen (Tylenol), Serum: <10 ug/mL — ABNORMAL LOW (ref 10–30)

## 2021-10-02 LAB — ETHANOL: Alcohol, Ethyl (B): <10 mg/dL (ref <10)

## 2021-10-02 LAB — SARS CORONAVIRUS 2 BY RT PCR: SARS Coronavirus 2 by RT PCR: NEGATIVE

## 2021-10-02 LAB — CREATININE, URINE, RANDOM: Creatinine, Urine: 94.74 mg/dL

## 2021-10-02 LAB — TSH: TSH: 1.218 u[IU]/mL (ref 0.350–4.500)

## 2021-10-02 LAB — LACTATE DEHYDROGENASE: LDH: 275 U/L — ABNORMAL HIGH (ref 98–192)

## 2021-10-02 LAB — MRSA NEXT GEN BY PCR, NASAL: MRSA by PCR Next Gen: NOT DETECTED

## 2021-10-02 LAB — PROCALCITONIN: Procalcitonin: 17.03 ng/mL

## 2021-10-02 MED ORDER — VANCOMYCIN HCL IN DEXTROSE 1-5 GM/200ML-% IV SOLN
1000.0000 mg | Freq: Two times a day (BID) | INTRAVENOUS | Status: DC
  Administered 2021-10-02 – 2021-10-04 (×4): 1000 mg via INTRAVENOUS
  Filled 2021-10-02 (×5): qty 200

## 2021-10-02 MED ORDER — ACETAMINOPHEN 325 MG PO TABS
650.0000 mg | ORAL_TABLET | ORAL | Status: DC | PRN
  Administered 2021-10-02: 650 mg via ORAL
  Filled 2021-10-02: qty 2

## 2021-10-02 MED ORDER — POTASSIUM CHLORIDE CRYS ER 20 MEQ PO TBCR
40.0000 meq | EXTENDED_RELEASE_TABLET | Freq: Once | ORAL | Status: AC
  Administered 2021-10-02: 40 meq via ORAL
  Filled 2021-10-02: qty 2

## 2021-10-02 MED ORDER — STERILE WATER FOR INJECTION IV SOLN
INTRAVENOUS | Status: DC
  Filled 2021-10-02 (×3): qty 1000
  Filled 2021-10-02: qty 150
  Filled 2021-10-02 (×2): qty 1000

## 2021-10-02 MED ORDER — INSULIN GLARGINE-YFGN 100 UNIT/ML ~~LOC~~ SOLN
10.0000 [IU] | Freq: Every day | SUBCUTANEOUS | Status: DC
  Administered 2021-10-02 – 2021-10-06 (×5): 10 [IU] via SUBCUTANEOUS
  Filled 2021-10-02 (×5): qty 0.1

## 2021-10-02 MED ORDER — CALCIUM GLUCONATE-NACL 1-0.675 GM/50ML-% IV SOLN
1.0000 g | Freq: Once | INTRAVENOUS | Status: AC
  Administered 2021-10-02: 1000 mg via INTRAVENOUS
  Filled 2021-10-02: qty 50

## 2021-10-02 MED ORDER — ACETAMINOPHEN 650 MG RE SUPP: 650.0000 mg | RECTAL | Status: DC | PRN

## 2021-10-02 NOTE — Assessment & Plan Note (Signed)
(  please see "high anion gap metabolic acidosis")

## 2021-10-02 NOTE — ED Notes (Signed)
Pt transferred from rm 28 to rm 1, this nurse assumed care of pt.

## 2021-10-02 NOTE — Assessment & Plan Note (Signed)
 #)   Hypokalemia: Present serum potassium level noted to be 3.2.  He has received 40 mEq of oral potassium chloride in the ED along with an existing order for 40 additional milliequivalents of IV potassium chloride.  Suspect contributions from recent GI losses from nausea, vomiting, diarrhea, as above.  Plan: Continue existing potassium supplementation, with repeat CMP ordered for the morning.  Add on serum magnesium level.  Monitor on telemetry.

## 2021-10-02 NOTE — ED Notes (Signed)
Walked patient to the bathroom patient did well 

## 2021-10-02 NOTE — Progress Notes (Addendum)
Inpatient Diabetes Program Recommendations  AACE/ADA: New Consensus Statement on Inpatient Glycemic Control (2015)  Target Ranges:  Prepandial:   less than 140 mg/dL      Peak postprandial:   less than 180 mg/dL (1-2 hours)      Critically ill patients:  140 - 180 mg/dL   Lab Results  Component Value Date   GLUCAP 185 (H) 10/02/2021   HGBA1C 10.6 (H) 10/01/2021    Review of Glycemic Control  Latest Reference Range & Units 10/01/21 23:06 10/02/21 00:17 10/02/21 00:54 10/02/21 05:50 10/02/21 08:14  Glucose-Capillary 70 - 99 mg/dL 168 (H) 372 (H) 902 (H) 201 (H) 185 (H)   Diabetes history: DM  2- New diagnosis Outpatient Diabetes medications:  None  Current orders for Inpatient glycemic control:  Semglee 10 units daily Novolog 0-9 units q 4 hours  Inpatient Diabetes Program Recommendations:    Referral received for new diagnosis of DM.  Patient is currently in the ED.  Will speak to him when appropriate regarding new diagnosis.    Will follow.   Thanks,  Beryl Meager, RN, BC-ADM Inpatient Diabetes Coordinator Pager [phone]  (8a-5p)

## 2021-10-02 NOTE — Assessment & Plan Note (Signed)
 #)   Anion gap metabolic acidosis: Likely multifactorial in nature, with contributions from presenting lactic acidosis in the setting of suspected severe sepsis, along with independent contribution from acute kidney injury.  will also continue to trend INR to evaluate hepatic synthetic function.  Differential also includes the possibility of rhabdomyolysis in the setting of lactic acidosis as well as urinalysis that demonstrates moderate blood without any evidence of RBCs, raising the possibility of myoglobinuria. Will add on CPK level to assess.  Of note, while there was an initial consideration for potential DKA, the presence of nonelevated beta hydroxybutyric acid as well as the absence of ketones renders this much less likely.  Consequently, will discontinue insulin drip at this time, as further detailed below.  Given report of routine alcohol consumption at least every 48 hours, differential includes the possibility of alcoholic lactic acidosis.   Plan.  Continuous IV fluids, as above.  Repeat lactate at 1 AM.  Further evaluation management of AKI, as above.  Add on CPK level.  Repeat INR in the morning.  Add on serum ethanol level.  Check urinary drug screen.  Repeat CMP in the morning.  Add on acetaminophen level.  Follow for results of VBG.

## 2021-10-02 NOTE — Progress Notes (Signed)
PROGRESS NOTE  Tom Merritt  ZOX:[id number] DOB: 01/22/1991 DOA: 10/01/2021 PCP: Pcp, No   Brief Narrative:  Patient is a 31 year old male without any significant past medical history who presents with nausea, vomiting, diarrhea from home.  Associated decreased oral intake.  No history of fever or chills, no history of drug abuse.  He was febrile on presentation, tachycardic, hypotensive.  Lab work showed sodium of 114, potassium of 3.2, creatinine of 1.5, elevated lactate elevated bilirubin, leukocytosis.  Chest x-ray did not show any acute cardiopulmonary process.  CT abdomen/pelvis showed, no other but no other evidence of acute intra-abdominal or intrapelvic process.  COVID PCR negative.  C. difficile negative patient was suspected to have sepsis of unknown etiology and started on broad antibiotics, IV fluids.  Assessment & Plan:  Principal Problem:   Severe sepsis (HCC) Active Problems:   High anion gap metabolic acidosis   Lactic acidosis   AKI (acute kidney injury) (HCC)   Transaminitis   Hyperglycemia   Hypokalemia   Acute hyponatremia  Severe sepsis: Unknown etiology.  Presented with fever, tachycardia, tachypnea, leukocytosis.  Presented with persistent nausea, vomiting, diarrhea.  CT abdomen/pelvis does not show any acute intra-abdominal/pelvic pathology.  Elevated lactic acid.  Follow-up cultures, continue IV fluids,iv antibiotics.  Elevated procalcitonin. Blood pressure better, remains in sinus tachycardia.  Diarrhea /vomiting: C. difficile negative.  Pending GI pathogen panel.  Continue IV fluids.  Will start Imodium after report of GI pathogen panel.  AKI/anion gap metabolic acidosis: Associated with diarrhea, vomiting.  Continue current IV fluids, monitor BMP  Hyponatremia/hypokalemia/hypocalcemia: Likely from hypovolemic hyponatremia associated with fluid loss along with electrolytes.  Continue current IV fluids.  Monitor BMP.  Potassium being supplemented.  Calcium  supplemented  Elevated LFTs/bilirubin/thrombocytopenia: Abdomen /pelvis CT, no other abnormalities but showed fatty liver..  Acute hepatitis panel negative, as well as HIV.  We will check right upper quadrant ultrasound.  Tylenol level negative.  Might be associated with elevated CK.  Patient is  occasional drinker, denies binge drinking.  Uncontrolled diabetes type 2 with hyperglycemia: Continue sliding scale insulin/Lantus.  Monitor blood sugars.  Hemoglobin A1c level of 10.  Diabetic coordinator consulted.  He will need insulin on discharge.  Not on any medications at home.  Uninsured: We will request to see consultation      DVT prophylaxis:SCDs Start: 10/01/21 2303     Code Status: Full Code  Family Communication: Family at bedside  Patient status: Inpatient  Patient is from : Home  Anticipated discharge to: Home  Estimated DC date: 2 to 3 days   Consultants: None  Procedures: None  Antimicrobials:  Anti-infectives (From admission, onward)    Start     Dose/Rate Route Frequency Ordered Stop   10/02/21 1000  metroNIDAZOLE (FLAGYL) IVPB 500 mg        500 mg 100 mL/hr over 60 Minutes Intravenous Every 12 hours 10/01/21 2307     10/02/21 0500  ceFEPIme (MAXIPIME) 2 g in sodium chloride 0.9 % 100 mL IVPB        2 g 200 mL/hr over 30 Minutes Intravenous Every 8 hours 10/01/21 2101 10/09/21 0459   10/01/21 2101  vancomycin (VANCOREADY) IVPB 1750 mg/350 mL        1,750 mg 175 mL/hr over 120 Minutes Intravenous Every 24 hours 10/01/21 2101 10/08/21 2114   10/01/21 1945  ceFEPIme (MAXIPIME) 2 g in sodium chloride 0.9 % 100 mL IVPB        2 g 200 mL/hr over  30 Minutes Intravenous  Once 10/01/21 1936 10/01/21 2140   10/01/21 1945  metroNIDAZOLE (FLAGYL) IVPB 500 mg        500 mg 100 mL/hr over 60 Minutes Intravenous  Once 10/01/21 1936 10/01/21 2140   10/01/21 1945  vancomycin (VANCOCIN) IVPB 1000 mg/200 mL premix  Status:  Discontinued        1,000 mg 200 mL/hr over 60  Minutes Intravenous  Once 10/01/21 1936 10/01/21 1942   10/01/21 1945  vancomycin (VANCOREADY) IVPB 1750 mg/350 mL  Status:  Discontinued        1,750 mg 175 mL/hr over 120 Minutes Intravenous  Once 10/01/21 1942 10/01/21 2101       Subjective: Patient seen and examined at the bedside this morning.  He feels better but his main complaint is diarrhea.  Denies abdomen pain or nausea or vomiting.  Continues to be in sinus tachycardia.  Blood pressure better.    Objective: Vitals:   10/02/21 0600 10/02/21 0615 10/02/21 0630 10/02/21 0645  BP: (!) 99/57 (!) 101/59 (!) 95/55 (!) 103/54  Pulse: (!) 122 (!) 123 (!) 123 (!) 120  Resp: (!) 42 (!) 50 (!) 28 (!) 53  Temp:      TempSrc:      SpO2: 94% 93% 93% 92%  Weight:      Height:       No intake or output data in the 24 hours ending 10/02/21 0277 Filed Weights   10/01/21 1929  Weight: 81.6 kg    Examination:  General exam: Appears weak, not in distress HEENT: PERRL Respiratory system:  no wheezes or crackles  Cardiovascular system: Sinus tachycardia Gastrointestinal system: Abdomen is nondistended, soft and nontender. Central nervous system: Alert and oriented Extremities: No edema, no clubbing ,no cyanosis Skin: No rashes, no ulcers,no icterus     Data Reviewed: I have personally reviewed following labs and imaging studies  CBC: Recent Labs  Lab 10/01/21 1745 10/02/21 0407  WBC 15.3* 11.1*  NEUTROABS 14.6* 10.6*  HGB 13.9 12.7*  HCT 37.8* 35.0*  MCV 75.3* 75.8*  PLT PLATELET CLUMPS NOTED ON SMEAR, UNABLE TO ESTIMATE 41*   Basic Metabolic Panel: Recent Labs  Lab 10/01/21 1745 10/01/21 2315 10/02/21 0407  NA 114* 117* 124*  K 3.2* 3.1* 3.4*  CL 79* 86* 92*  CO2 18* 16* 16*  GLUCOSE 318* 268* 221*  BUN 37* 36* 31*  CREATININE 1.52* 1.54* 1.43*  CALCIUM 6.8* 5.8* 6.3*  MG  --  1.0* 1.1*     Recent Results (from the past 240 hour(s))  SARS Coronavirus 2 by RT PCR (hospital order, performed in [facility]  hospital lab) *cepheid single result test* Anterior Nasal Swab     Status: None   Collection Time: 10/02/21  1:05 AM   Specimen: Anterior Nasal Swab  Result Value Ref Range Status   SARS Coronavirus 2 by RT PCR NEGATIVE NEGATIVE Final    Comment: (NOTE) SARS-CoV-2 target nucleic acids are NOT DETECTED.  The SARS-CoV-2 RNA is generally detectable in upper and lower respiratory specimens during the acute phase of infection. The lowest concentration of SARS-CoV-2 viral copies this assay can detect is 250 copies / mL. A negative result does not preclude SARS-CoV-2 infection and should not be used as the sole basis for treatment or other patient management decisions.  A negative result may occur with improper specimen collection / handling, submission of specimen other than nasopharyngeal swab, presence of viral mutation(s) within the areas targeted by this  assay, and inadequate number of viral copies (<250 copies / mL). A negative result must be combined with clinical observations, patient history, and epidemiological information.  Fact Sheet for Patients:   RoadLapTop.co.za  Fact Sheet for Healthcare Providers: http://kim-miller.com/  This test is not yet approved or  cleared by the [place] FDA and has been authorized for detection and/or diagnosis of SARS-CoV-2 by FDA under an Emergency Use Authorization (EUA).  This EUA will remain in effect (meaning this test can be used) for the duration of the COVID-19 declaration under Section 564(b)(1) of the Act, 21 U.S.C. section 360bbb-3(b)(1), unless the authorization is terminated or revoked sooner.  Performed at [facility], [address] [place], [place] [address]   C Difficile Quick Screen w PCR reflex     Status: None   Collection Time: 10/02/21  5:30 AM   Specimen: Stool  Result Value Ref Range Status   C Diff antigen NEGATIVE NEGATIVE Final   C Diff toxin NEGATIVE  NEGATIVE Final   C Diff interpretation No C. difficile detected.  Final    Comment: Performed at [facility], [address], [place], [place] [address]     Radiology Studies: CT ABDOMEN PELVIS W CONTRAST  Result Date: 10/01/2021 CLINICAL DATA:  Acute abdominal pain with nausea and vomiting for several days, initial encounter EXAM: CT ABDOMEN AND PELVIS WITH CONTRAST TECHNIQUE: Multidetector CT imaging of the abdomen and pelvis was performed using the standard protocol following bolus administration of intravenous contrast. RADIATION DOSE REDUCTION: This exam was performed according to the departmental dose-optimization program which includes automated exposure control, adjustment of the mA and/or kV according to patient size and/or use of iterative reconstruction technique. CONTRAST:  OMNIPAQUE IOHEXOL 300 MG/ML  SOLN COMPARISON:  None Available. FINDINGS: Lower chest: Mild dependent atelectatic changes are noted. No focal infiltrate is seen. Hepatobiliary: Fatty liver is noted.  Gallbladder is decompressed. Pancreas: Unremarkable. No pancreatic ductal dilatation or surrounding inflammatory changes. Spleen: Normal in size without focal abnormality. Adrenals/Urinary Tract: Adrenal glands are within normal limits. Kidneys demonstrate a normal enhancement pattern. No renal calculi or obstructive changes are noted. Ureters are within normal limits. The bladder is well distended. Stomach/Bowel: No obstructive or inflammatory changes of the colon are seen. The appendix is within normal limits. Small bowel and stomach are unremarkable. Vascular/Lymphatic: No significant vascular findings are present. No enlarged abdominal or pelvic lymph nodes. Reproductive: Prostate is unremarkable. Other: No abdominal wall hernia or abnormality. No abdominopelvic ascites. Musculoskeletal: No acute or significant osseous findings. IMPRESSION: Fatty liver. No acute abnormality is identified to correspond with the given  clinical history. Electronically Signed   By: Alcide Clever M.D.   On: 10/01/2021 21:36   DG Chest Portable 1 View  Result Date: 10/01/2021 CLINICAL DATA:  Shortness of breath.  Fever and vomiting. EXAM: PORTABLE CHEST 1 VIEW COMPARISON:  None Available. FINDINGS: Low lung volumes and soft tissue attenuation from habitus limits assessment. Heart size is normal for technique. No focal airspace disease, pleural effusion, pneumothorax or pulmonary edema. No acute osseous abnormalities are seen. IMPRESSION: Low lung volumes without acute abnormality. Electronically Signed   By: Narda Rutherford M.D.   On: 10/01/2021 18:24    Scheduled Meds:  insulin aspart  0-9 Units Subcutaneous Q4H   pantoprazole (PROTONIX) IV  40 mg Intravenous Q24H   Continuous Infusions:  sodium chloride 125 mL/hr at 10/01/21 2311   ceFEPime (MAXIPIME) IV Stopped (10/02/21 6440)   metronidazole  vancomycin Stopped (10/02/21 0008)     LOS: 1 day   Burnadette Pop, MD [facility] P7/05/2021, 7:22 AM

## 2021-10-02 NOTE — Assessment & Plan Note (Signed)
 #)   Severe sepsis: Criteria met for sepsis via presenting objective fever, tachycardia, tachypnea, leukocytosis.  Source not entirely clear at this time, although there is some suspicion for gastroenteritis given presenting nausea, vomiting, diarrhea, although CT abdomen/pelvis shows no evidence of inflammatory bowel changes or any other acute intra-abdominal/acute intrapelvic process.  will send stool studies to further evaluate, as further detailed below.   Lactic acid level: Initially noted to be 4.3, with repeat value during that 3.6. Of note, given the associated presence of suspected end organ damage in the form of concominant presenting elevated lactate as well as acute kidney injury, criteria are met for pt's sepsis to be considered severe in nature.  He has received a 3.5 L NS bolus, which equates to greater than 30 mL/kg IVF bolus.  We will continue to trend in setting blood gas levels, as below.  In terms of other considered infectious sources, the patient denies any acute respiratory symptoms, and chest x-ray shows no evidence of acute cardiopulmonary process, including no evidence of infiltrate or edema, effusion, or pneumothorax.  Urinalysis not consistent with UTI.  No rash.  Given concomitant transaminitis with GI symptoms, will also check COVID-19 PCR.  Hepatitis screen result pending at this time.  Acute viral hepatitis panel also pending.  In the setting of objective fever, nausea, vomiting, hyponatremia, also considered in differential the possibility of meningitis, however the patient denies any associated neck stiffness, thereby decreasing index of suspicion for this.  Of note, he has already received IV vancomycin, cefepime, Flagyl in the ED, therefore therapeutic no associated benefit from inclusion of corticosteroids at this time.will refrain from pursuit of LP at this time, while closely monitoring ensuing clinical trend.   Of note, cultures x2 were collected prior to initiation of  the above IV antibiotics, which also be continued for now, including IV Flagyl for provision of anaerobic coverage given potential for enteric infectious source.    Plan: CBC w/ diff and CMP in AM.  Follow for results of blood cx's x 2. Abx: Continue IV vancomycin, cefepime, Flagyl, as above.  Repeat lactic acid level at 1 AM.  Continue existing normal saline at 125 cc/h.  Add on procalcitonin level check COVID-19 PCR.  Follow for results of acute viral hepatitis panel as well as HIV screen.  Stool studies in the form of GI panel by PCR as well as C. Difficile screen.  Prn IV Zofran.  Refraining from antimotility agents for now pending results of stool studies.

## 2021-10-02 NOTE — ED Notes (Signed)
MD Adhikari notified about pt current temp of 100.8.  Awaiting orders at this time.

## 2021-10-02 NOTE — Progress Notes (Signed)
   10/02/21 1505  Assess: MEWS Score  Temp 99 F (37.2 C)  BP 101/64  MAP (mmHg) 76  Pulse Rate (!) 117  ECG Heart Rate (!) 118  Resp (!) 30  Level of Consciousness Alert  SpO2 95 %  O2 Device Room Air  Patient Activity (if Appropriate) In bed  Assess: MEWS Score  MEWS Temp 0  MEWS Systolic 0  MEWS Pulse 2  MEWS RR 2  MEWS LOC 0  MEWS Score 4  MEWS Score Color Red  Assess: if the MEWS score is Yellow or Red  Were vital signs taken at a resting state? Yes  Focused Assessment No change from prior assessment  Does the patient meet 2 or more of the SIRS criteria? Yes  Does the patient have a confirmed or suspected source of infection? No  Provider and Rapid Response Notified? Yes  MEWS guidelines implemented *See Row Information* No, previously red, continue vital signs every 4 hours  Treat  Pain Scale 0-10  Pain Score 0  Escalate  MEWS: Escalate Red: discuss with charge nurse/RN and provider, consider discussing with RRT  Notify: Charge Nurse/RN  Name of Charge Nurse/RN Notified Wilkie Aye J  Date Charge Nurse/RN Notified 10/02/21  Time Charge Nurse/RN Notified 1515  Notify: Provider  Provider Name/Title A. Adhikari MD  Date Provider Notified 10/02/21  Time Provider Notified 1518  Method of Notification Page  Notification Reason Critical result  Provider response No new orders (Adhikari had already seen the patient in the ED)  Time of Provider Response 1518  Document  Patient Outcome Other (Comment) (stable to remain on unit)  Progress note created (see row info) Yes  Assess: SIRS CRITERIA  SIRS Temperature  0  SIRS Pulse 1  SIRS Respirations  1  SIRS WBC 0  SIRS Score Sum  2

## 2021-10-02 NOTE — ED Notes (Signed)
Walked patient to the bathroom changed patient sheets

## 2021-10-02 NOTE — Assessment & Plan Note (Signed)
  #)   Acute kidney injury: Presenting serum creatinine noted to be 1.52, presumed represent acute kidney injury in the setting of prerenal influences from dehydration as result of increased GI losses and diminished oral intake over the last several days, although no prior serum creatinine data point available for point comparison.  Presenting labs also demonstrate evidence of prerenal azotemia consistent with the above.  Urinalysis with microscopy notable for the presence hyaline cast, consistent with a picture of dehydration.  Also evaluate for rhabdomyolysis, as further detailed below.  Plan: Continuous IV fluids, as above.  Monitor strict I's and O's and daily weights.  Tempt avoid nephrotoxic agents.  Add on CPK level.  Add on random urine sodium as well as random urine creatinine.  Repeat CMP in the morning.

## 2021-10-02 NOTE — Assessment & Plan Note (Signed)
  #)   Hyperglycemia: In the context of no known history of underlying diabetes, patient found to be mildly hyperglycemic, with initial blood sugars in the low 300s, subsequent proving into the 200s following IV fluids and initiation of insulin drip.  Will assess for previously undiagnosed diabetes hemoglobin A1c level.  As noted above, in the absence of elevation of beta hydroxybutyric acid in the absence of any ketones, presentation appears less suggestive of DKA.  We will therefore discontinue insulin drip at this time, closely monitoring blood sugar via serial CBG monitoring along with associated sliding scale insulin.  Likely also a contraction from dehydration along with contribution from facility stress in the setting of presenting severe sepsis.  CT abdomen/pelvis shows no evidence of acute pancreatitis, lipase not elevated.  Plan: Discontinue insulin drip at this time.  Every 4 hours CBG monitoring along with sliding scale insulin.  Add on hemoglobin A1c level.  IV fluids, as above.  Repeat CMP in the morning.

## 2021-10-02 NOTE — Assessment & Plan Note (Signed)
 #)   Acute transaminitis: Mildly elevated AST/ALT along with total bilirubin of 11.7, without any elevation of alkaline phosphatase, appearing consistent with cholestatic pattern, while CT abdomen/pelvis with IV contrast shows no evidence of acute gallbladder pathology or any evidence of biliary obstruction nor any evidence of acute pancreatitis.  Evidence of fatty liver observed on CT scan, as above.  Differential includes acute alcoholic hepatitis versus viral process, particularly in the setting of, nausea, vomiting, diarrhea.  Of note acute viral hepatitis panel currently pending.  Also checking for COVID.  Also considered the possibility of chronic mild elevation AST ALT in the setting of the patient's chronic alcohol consumption with the possibility, although less likely, of a hemolytic process to account for the elevated bilirubin level.  Check direct bilirubin at this time to further assess.  If indirect predominant, would consider pursuit of peripheral smear.  May also consider repeat quadrant ultrasound depending upon ensuing clinical trend as well as trending in setting transaminases per updated CMP in the morning. Of note, there would be a contraindication to initiation of systemic steroids for acute alcoholic hepatitis in the setting of concomitant acute kidney injury.  Plan: Add on direct bilirubin.  Repeat CMP in the morning.  Follow for results of acute viral otitis panel as well as HIV screen.  Check COVID-19 PCR.  Repeat INR in the morning and check PTT.  Check LDH.  Repeat CBC in the morning.  Add on acetaminophen level.  Urinary drug screen.  Add on serum ethanol level.

## 2021-10-02 NOTE — Assessment & Plan Note (Signed)
 #)   Acute hypovolemic hypoosmolar hyponatremia: Presenting serum sodium level noted to be 117, corrected for concomitant hyperglycemia, as further detailed above.  Presumed to be acute in nature, although no prior serum sodium data points available for point comparison.  Suspect that this is hypovolemic in nature in the setting of recent increase in GI losses, as above.  We will continue existing IV fluids, closely monitor ensuing serum sodium trend as outlined below.   Plan: Continuous NS, as above.  Repeat CMP in the morning.  BMP ordered for 9 AM on 10/02/2020.  Monitor strict I's and O's and daily weights.  Add on Premeal insulin as well as random urine osmolality.  We will also check serum osmolality to confirm suspected hypoosmolar etiology.  Check TSH.

## 2021-10-02 NOTE — Progress Notes (Signed)
Pharmacy Antibiotic Note  Tom Merritt is a 31 y.o. male for which pharmacy has been consulted for cefepime and vancomycin dosing for sepsis.  Patient presenting with N/V/D, SOB, cough.  SCr 1.43 WBC 11.1; LA 3.4; T 98.5 F; HR 120; RR 27  Plan: Metronidazole per MD Continue Cefepime 2g q8hr Change Vancomycin to 1000mg  IV q12h (eAUC~516) Trend WBC, Fever, Renal function, & Clinical course F/u cultures, clinical course, WBC, fever De-escalate when able  Height: 5\' 9"  (175.3 cm) Weight: 81.6 kg (180 lb) IBW/kg (Calculated) : 70.7  Temp (24hrs), Avg:100 F (37.8 C), Min:98.5 F (36.9 C), Max:100.9 F (38.3 C)  Recent Labs  Lab 10/01/21 1745 10/01/21 2045 10/01/21 2315 10/02/21 0407  WBC 15.3*  --   --  11.1*  CREATININE 1.52*  --  1.54* 1.43*  LATICACIDVEN 4.3* 3.6* 3.8* 3.4*     Estimated Creatinine Clearance: 74.2 mL/min (A) (by C-G formula based on SCr of 1.43 mg/dL (H)).    No Known Allergies  Antimicrobials this admission: cefepime 7/2 >>  metronidazole 7/2 >>  vancomycin 7/2 >>   Microbiology results: 7/3 Blood cx x2 - pending 7/3 Cdiff - antigen negative, toxin negative  7/3 Stool PCR - pending  Thank you for allowing pharmacy to be a part of this patient's care.  9/2, PharmD, BCPS Clinical Pharmacist 10/02/2021 8:28 AM   Please refer to AMION for pharmacy phone number

## 2021-10-02 NOTE — Plan of Care (Signed)

## 2021-10-03 ENCOUNTER — Encounter (HOSPITAL_COMMUNITY): Payer: Self-pay | Admitting: Internal Medicine

## 2021-10-03 DIAGNOSIS — R197 Diarrhea, unspecified: Secondary | ICD-10-CM

## 2021-10-03 DIAGNOSIS — R17 Unspecified jaundice: Secondary | ICD-10-CM

## 2021-10-03 LAB — COMPREHENSIVE METABOLIC PANEL
ALT: 50 U/L — ABNORMAL HIGH (ref 0–44)
AST: 52 U/L — ABNORMAL HIGH (ref 15–41)
Albumin: 2 g/dL — ABNORMAL LOW (ref 3.5–5.0)
Alkaline Phosphatase: 81 U/L (ref 38–126)
Anion gap: 11 (ref 5–15)
BUN: 29 mg/dL — ABNORMAL HIGH (ref 6–20)
CO2: 20 mmol/L — ABNORMAL LOW (ref 22–32)
Calcium: 6.6 mg/dL — ABNORMAL LOW (ref 8.9–10.3)
Chloride: 101 mmol/L (ref 98–111)
Creatinine, Ser: 1.14 mg/dL (ref 0.61–1.24)
GFR, Estimated: >60 mL/min (ref >60)
Glucose, Bld: 163 mg/dL — ABNORMAL HIGH (ref 70–99)
Potassium: 2.9 mmol/L — ABNORMAL LOW (ref 3.5–5.1)
Sodium: 132 mmol/L — ABNORMAL LOW (ref 135–145)
Total Bilirubin: 10.7 mg/dL — ABNORMAL HIGH (ref 0.3–1.2)
Total Protein: 5.1 g/dL — ABNORMAL LOW (ref 6.5–8.1)

## 2021-10-03 LAB — CBC
HCT: 32.2 % — ABNORMAL LOW (ref 39.0–52.0)
Hemoglobin: 11.9 g/dL — ABNORMAL LOW (ref 13.0–17.0)
MCH: 28.1 pg (ref 26.0–34.0)
MCHC: 37 g/dL — ABNORMAL HIGH (ref 30.0–36.0)
MCV: 75.9 fL — ABNORMAL LOW (ref 80.0–100.0)
Platelets: 44 10*3/uL — ABNORMAL LOW (ref 150–400)
RBC: 4.24 MIL/uL (ref 4.22–5.81)
RDW: 12.9 % (ref 11.5–15.5)
WBC: 14.6 10*3/uL — ABNORMAL HIGH (ref 4.0–10.5)
nRBC: 0 % (ref 0.0–0.2)

## 2021-10-03 LAB — GLUCOSE, CAPILLARY
Glucose-Capillary: 151 mg/dL — ABNORMAL HIGH (ref 70–99)
Glucose-Capillary: 162 mg/dL — ABNORMAL HIGH (ref 70–99)
Glucose-Capillary: 186 mg/dL — ABNORMAL HIGH (ref 70–99)
Glucose-Capillary: 191 mg/dL — ABNORMAL HIGH (ref 70–99)
Glucose-Capillary: 210 mg/dL — ABNORMAL HIGH (ref 70–99)
Glucose-Capillary: 220 mg/dL — ABNORMAL HIGH (ref 70–99)
Glucose-Capillary: 242 mg/dL — ABNORMAL HIGH (ref 70–99)

## 2021-10-03 LAB — RETICULOCYTES
Immature Retic Fract: 4.5 % (ref 2.3–15.9)
RBC.: 4.2 MIL/uL — ABNORMAL LOW (ref 4.22–5.81)
Retic Count, Absolute: 35.7 10*3/uL (ref 19.0–186.0)
Retic Ct Pct: 0.9 % (ref 0.4–3.1)

## 2021-10-03 LAB — LIPID PANEL
Cholesterol: 99 mg/dL (ref 0–200)
HDL: <10 mg/dL — ABNORMAL LOW (ref >40)
Triglycerides: 365 mg/dL — ABNORMAL HIGH (ref <150)
VLDL: 73 mg/dL — ABNORMAL HIGH (ref 0–40)

## 2021-10-03 LAB — IRON AND TIBC
Iron: 24 ug/dL — ABNORMAL LOW (ref 45–182)
Saturation Ratios: 14 % — ABNORMAL LOW (ref 17.9–39.5)
TIBC: 168 ug/dL — ABNORMAL LOW (ref 250–450)
UIBC: 144 ug/dL

## 2021-10-03 LAB — FOLATE: Folate: 14.9 ng/mL (ref >5.9)

## 2021-10-03 LAB — FERRITIN: Ferritin: 1225 ng/mL — ABNORMAL HIGH (ref 24–336)

## 2021-10-03 LAB — MAGNESIUM: Magnesium: 1.6 mg/dL — ABNORMAL LOW (ref 1.7–2.4)

## 2021-10-03 LAB — VITAMIN B12: Vitamin B-12: 4073 pg/mL — ABNORMAL HIGH (ref 180–914)

## 2021-10-03 MED ORDER — WHITE PETROLATUM EX OINT
TOPICAL_OINTMENT | CUTANEOUS | Status: DC | PRN
  Filled 2021-10-03: qty 28.35

## 2021-10-03 MED ORDER — MAGNESIUM SULFATE 2 GM/50ML IV SOLN
2.0000 g | Freq: Once | INTRAVENOUS | Status: AC
  Administered 2021-10-03: 2 g via INTRAVENOUS
  Filled 2021-10-03: qty 50

## 2021-10-03 MED ORDER — LOPERAMIDE HCL 2 MG PO CAPS
2.0000 mg | ORAL_CAPSULE | ORAL | Status: DC | PRN
Start: 2021-10-03 — End: 2021-10-06

## 2021-10-03 MED ORDER — POTASSIUM CHLORIDE CRYS ER 20 MEQ PO TBCR
40.0000 meq | EXTENDED_RELEASE_TABLET | ORAL | Status: AC
  Administered 2021-10-03 (×2): 40 meq via ORAL
  Filled 2021-10-03 (×2): qty 2

## 2021-10-03 MED ORDER — CALCIUM GLUCONATE-NACL 1-0.675 GM/50ML-% IV SOLN
1.0000 g | Freq: Once | INTRAVENOUS | Status: AC
  Administered 2021-10-03: 1000 mg via INTRAVENOUS
  Filled 2021-10-03: qty 50

## 2021-10-03 MED ORDER — PANTOPRAZOLE SODIUM 40 MG PO TBEC: 40.0000 mg | DELAYED_RELEASE_TABLET | Freq: Every day | ORAL | Status: DC

## 2021-10-03 NOTE — Consult Note (Addendum)
[facility] Consult: 10:14 AM 10/03/2021  LOS: 2 days    Referring Provider: Dr Renford Dills  Primary Care Physician:  Pcp, No.  Never sees medical providers. Primary Gastroenterologist:  unassigned     Reason for Consultation: Elevated T. bili.   HPI: Tom Merritt is a 31 y.o. male.  No prior medical record or medical care   New tattoo placed last week. Presented to the hospital 2 evenings ago for evaluation nausea, vomiting, diarrhea, fever, dyspnea, productive cough. Found to be diabetic with glucose as high as 284. Na 114.  Potassium 3.2.  Creatinine 1.5.  Elevated lactate.  Leukocytosis. T bili 11.7.  Alkaline phosphatase normal.  AST/ALT 62/64. Acute hepatitis panel including hep A IgM, hep B surface Ag, hep B core IgM, HCV Ab are all negative.   C. difficile and GI path PCR negative. Hb 11.9.  MCV 75.  Platelets 44.  WBCs 14.6. INR 1.4. CTAP w contrast: Atelectasis.  Fatty liver.  Decompressed gallbladder.  Unremarkable pancreas.  Unremarkable pancreatic duct. Ultrasound abdomen: 3.2 mm CBD.  No Coley lithiasis, no cholecystitis.  Hepatic steatosis.  Denies abdominal pain.  Diarrhea has improved.  He is now having loose, nonbloody stools, 4 during the day yesterday, 2 overnight and 2 so far this morning.  This is much improved from maximum output of 12 or more bowel movements over a 24-hour period.  No recent travel.  Lives at home with multiple family members including a 76-year-old niece but she has not had any intestinal illnesses.  Drinks a 12 pack daily a couple of times a week.  There are days when he does not drink anything.  Has had problems with lower extremity swelling and heaviness in his legs which makes it difficult to walk and he feels weak.  Lives with his mother, father, brother and  26-year-old niece.  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 Family history negative for diabetes, liver disease, cancers.   No previous surgeries or medical issues.  Prior to Admission medications   Not on File    Scheduled Meds:  insulin aspart  0-9 Units Subcutaneous Q4H   insulin glargine-yfgn  10 Units Subcutaneous Daily   pantoprazole (PROTONIX) IV  40 mg Intravenous Q24H   potassium chloride  40 mEq Oral Q1 Hr x 2   Infusions:  calcium gluconate     ceFEPime (MAXIPIME) IV 2 g (10/03/21 0450)   magnesium sulfate bolus IVPB 2 g (10/03/21 0956)   metronidazole 500 mg (10/02/21 2311)   sodium bicarbonate 150 mEq in sterile water 1,150 mL infusion 125 mL/hr at 10/03/21 0348   vancomycin 1,000 mg (10/02/21 2202)   PRN Meds: acetaminophen **OR** acetaminophen, dextrose, loperamide, ondansetron (ZOFRAN) IV   Allergies as of 10/01/2021   (No Known Allergies)    History reviewed. No pertinent family history.  Social History   Socioeconomic History   Marital status: Single    Spouse name: Not on file   Number of children: Not on file   Years of education: Not on file  Highest education level: Not on file  Occupational History   Not on file  Tobacco Use   Smoking status: Never   Smokeless tobacco: Never  Substance and Sexual Activity   Alcohol use: Yes    Comment: 3-4 beers (12 oz) every other day   Drug use: Never   Sexual activity: Not on file  Other Topics Concern   Not on file  Social History Narrative   Not on file   Social Determinants of Health   Financial Resource Strain: Not on file  Food Insecurity: Not on file  Transportation Needs: Not on file  Physical Activity: Not on file  Stress: Not on file  Social Connections: Not on file  Intimate Partner Violence: Not on file    REVIEW OF SYSTEMS: Constitutional: Weakness ENT:  No nose bleeds Pulm: No shortness of breath, no cough CV:  No palpitations, no  angina.  Lower extremity swelling. GU:  No hematuria, denies polyuria.  Denies dark-colored urine GI: As per HPI. Heme: No unusual or excessive bleeding or bruising. Transfusions: None Neuro: Blurry vision especially in the morning.  No headaches, no peripheral tingling or numbness. Derm:  No itching, no rash or sores.  Not noticed any jaundice. Endocrine:  No sweats or chills.  No polyuria or dysuria Immunization: Queried. Travel:  None beyond local counties in last few months.    PHYSICAL EXAM: Vital signs in last 24 hours: Vitals:   10/03/21 0348 10/03/21 0808  BP: 112/73 (!) 99/51  Pulse: (!) 123   Resp: 20 (!) 34  Temp: 97.6 F (36.4 C) 98.2 F (36.8 C)  SpO2: 97%    Wt Readings from Last 3 Encounters:  10/03/21 103.6 kg    General: Obese, moderately unwell appearing but comfortable, alert and able to provide good history Head: No facial asymmetry or swelling.  No signs of head trauma. Eyes: No scleral icterus or conjunctival pallor. Ears: No hearing deficit Nose: No congestion or discharge Mouth: Oral mucosa is moist, pink, clear.  Good dentition.  Tongue midline. Neck: No JVD, no masses, no thyromegaly. Lungs: Clear bilaterally.  No labored breathing or cough. Heart: RRR.  No MRG.  S1, S2 present. Abdomen: Obese, soft.  Nontender.  Active bowel sounds.  No HSM, masses, bruits, hernias..   Rectal: Deferred Musc/Skeltl: No joint redness, swelling or gross deformity. Extremities: Nonpitting lower leg/pedal edema. Neurologic: Oriented x3.  Moves all 4 limbs without weakness.  No tremors. Skin: Not jaundiced.  Did not see telangiectasia on the trunk or limbs. Nodes: No cervical adenopathy. Psych: Cooperative, calm, pleasant.  Fluid speech.  Intake/Output from previous day: 07/03 0701 - 07/04 0700 In: 3001 [P.O.:480; I.V.:1590.9; IV Piggyback:930.1] Out: -  Intake/Output this shift: No intake/output data recorded.  LAB RESULTS: Recent Labs    10/01/21 1745  10/02/21 0407 10/03/21 0152  WBC 15.3* 11.1* 14.6*  HGB 13.9 12.7* 11.9*  HCT 37.8* 35.0* 32.2*  PLT PLATELET CLUMPS NOTED ON SMEAR, UNABLE TO ESTIMATE 41* 44*   BMET Lab Results  Component Value Date   NA 132 (L) 10/03/2021   NA 126 (L) 10/02/2021   NA 124 (L) 10/02/2021   K 2.9 (L) 10/03/2021   K 3.5 10/02/2021   K 3.4 (L) 10/02/2021   CL 101 10/03/2021   CL 97 (L) 10/02/2021   CL 92 (L) 10/02/2021   CO2 20 (L) 10/03/2021   CO2 15 (L) 10/02/2021   CO2 16 (L) 10/02/2021   GLUCOSE 163 (H) 10/03/2021  GLUCOSE 171 (H) 10/02/2021   GLUCOSE 221 (H) 10/02/2021   BUN 29 (H) 10/03/2021   BUN 33 (H) 10/02/2021   BUN 31 (H) 10/02/2021   CREATININE 1.14 10/03/2021   CREATININE 1.62 (H) 10/02/2021   CREATININE 1.43 (H) 10/02/2021   CALCIUM 6.6 (L) 10/03/2021   CALCIUM 6.5 (L) 10/02/2021   CALCIUM 6.3 (LL) 10/02/2021   LFT Recent Labs    10/01/21 1745 10/01/21 2315 10/02/21 0407 10/03/21 0152  PROT 6.5  --  5.4* 5.1*  ALBUMIN 2.8*  --  2.2* 2.0*  AST 62*  --  49* 52*  ALT 64*  --  54* 50*  ALKPHOS 67  --  67 81  BILITOT 11.7*  --  10.9* 10.7*  BILIDIR  --  7.2*  --   --    PT/INR Lab Results  Component Value Date   INR 1.4 (H) 10/02/2021   INR 1.4 (H) 10/01/2021   Hepatitis Panel Recent Labs    10/01/21 2315  HEPBSAG NON REACTIVE  HCVAB NON REACTIVE  HEPAIGM NON REACTIVE  HEPBIGM NON REACTIVE   C-Diff No components found for: "CDIFF" Lipase     Component Value Date/Time   LIPASE 63 (H) 10/01/2021 1745    Drugs of Abuse     Component Value Date/Time   LABOPIA NONE DETECTED 10/01/2021 2029   COCAINSCRNUR NONE DETECTED 10/01/2021 2029   LABBENZ NONE DETECTED 10/01/2021 2029   AMPHETMU NONE DETECTED 10/01/2021 2029   THCU NONE DETECTED 10/01/2021 2029   LABBARB NONE DETECTED 10/01/2021 2029     RADIOLOGY STUDIES: US Abdomen Limited RUQ (LIVER/GB)  Result Date: 10/02/2021 CLINICAL DATA:  Elevated liver enzymes. EXAM: ULTRASOUND ABDOMEN LIMITED  RIGHT UPPER QUADRANT COMPARISON:  None Available. FINDINGS: Gallbladder: No gallstones or wall thickening visualized. No sonographic Murphy sign noted by sonographer. Common bile duct: Diameter: 3.2 mm Liver: No focal lesion identified. Echogenic hepatic parenchyma suggesting hepatic steatosis. Portal vein is patent on color Doppler imaging with normal direction of blood flow towards the liver. Other: None. IMPRESSION: 1. No evidence of cholelithiasis or acute cholecystitis. 2. Echogenic hepatic parenchyma suggesting hepatic steatosis. Electronically Signed   By: Larose Hires D.O.   On: 10/02/2021 08:36   CT ABDOMEN PELVIS W CONTRAST  Result Date: 10/01/2021 CLINICAL DATA:  Acute abdominal pain with nausea and vomiting for several days, initial encounter EXAM: CT ABDOMEN AND PELVIS WITH CONTRAST TECHNIQUE: Multidetector CT imaging of the abdomen and pelvis was performed using the standard protocol following bolus administration of intravenous contrast. RADIATION DOSE REDUCTION: This exam was performed according to the departmental dose-optimization program which includes automated exposure control, adjustment of the mA and/or kV according to patient size and/or use of iterative reconstruction technique. CONTRAST:  OMNIPAQUE IOHEXOL 300 MG/ML  SOLN COMPARISON:  None Available. FINDINGS: Lower chest: Mild dependent atelectatic changes are noted. No focal infiltrate is seen. Hepatobiliary: Fatty liver is noted.  Gallbladder is decompressed. Pancreas: Unremarkable. No pancreatic ductal dilatation or surrounding inflammatory changes. Spleen: Normal in size without focal abnormality. Adrenals/Urinary Tract: Adrenal glands are within normal limits. Kidneys demonstrate a normal enhancement pattern. No renal calculi or obstructive changes are noted. Ureters are within normal limits. The bladder is well distended. Stomach/Bowel: No obstructive or inflammatory changes of the colon are seen. The appendix is within  normal limits. Small bowel and stomach are unremarkable. Vascular/Lymphatic: No significant vascular findings are present. No enlarged abdominal or pelvic lymph nodes. Reproductive: Prostate is unremarkable. Other: No abdominal wall hernia or  abnormality. No abdominopelvic ascites. Musculoskeletal: No acute or significant osseous findings. IMPRESSION: Fatty liver. No acute abnormality is identified to correspond with the given clinical history. Electronically Signed   By: Alcide Clever M.D.   On: 10/01/2021 21:36   DG Chest Portable 1 View  Result Date: 10/01/2021 CLINICAL DATA:  Shortness of breath.  Fever and vomiting. EXAM: PORTABLE CHEST 1 VIEW COMPARISON:  None Available. FINDINGS: Low lung volumes and soft tissue attenuation from habitus limits assessment. Heart size is normal for technique. No focal airspace disease, pleural effusion, pneumothorax or pulmonary edema. No acute osseous abnormalities are seen. IMPRESSION: Low lung volumes without acute abnormality. Electronically Signed   By: Narda Rutherford M.D.   On: 10/01/2021 18:24      IMPRESSION:   Elevated T. bili and transaminases.  Improving.  Fatty liver on ultrasound and CT. alcohol use disorder with consumption of up to a 12 pack daily a few times a week.  Thrombocytopenia.  No splenomegaly on ultrasound, CT.  Microcytosis with post IV resuscitation anemia.  New diagnosis DM.  Current management is insulin.  Hemoglobin A1c 10.6.  Diarrhea.  C. difficile and stool PCR panel testing negative.  No gastrointestinal findings on CT scan.  Stool frequency and consistency improving.  Hyponatremia.  Severe, improved from 114..  132.  AKI.  Sepsis.  Fever.  Leukocytosis.  Blood cultures negative so far.  Antibiotics vancomycin, Flagyl in place.  Obesity.    LE edema   PLAN:       Check IgG, ANA, AMA, ceruloplasmin, A1 AT, ferritin, anemia panel.  Follow LFTs.    Needs to establish care with a PCP.  Once discharged, needs  follow-up of  LFTs within a couple of weeks and GI follow-up within 4 to 6 weeks.  Discontinued the IV Protonix.  Switched his diet from regular to carb modified.  Given lower extremity swelling, does he need echocardiogram?   Jennye Moccasin  10/03/2021, 10:14 AM Phone [phone]

## 2021-10-03 NOTE — Plan of Care (Signed)

## 2021-10-03 NOTE — Plan of Care (Signed)
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
## 2021-10-04 DIAGNOSIS — R7989 Other specified abnormal findings of blood chemistry: Secondary | ICD-10-CM

## 2021-10-04 LAB — COMPREHENSIVE METABOLIC PANEL
ALT: 53 U/L — ABNORMAL HIGH (ref 0–44)
AST: 90 U/L — ABNORMAL HIGH (ref 15–41)
Albumin: 1.8 g/dL — ABNORMAL LOW (ref 3.5–5.0)
Alkaline Phosphatase: 98 U/L (ref 38–126)
Anion gap: 11 (ref 5–15)
BUN: 14 mg/dL (ref 6–20)
CO2: 26 mmol/L (ref 22–32)
Calcium: 6.8 mg/dL — ABNORMAL LOW (ref 8.9–10.3)
Chloride: 96 mmol/L — ABNORMAL LOW (ref 98–111)
Creatinine, Ser: 0.74 mg/dL (ref 0.61–1.24)
GFR, Estimated: >60 mL/min (ref >60)
Glucose, Bld: 164 mg/dL — ABNORMAL HIGH (ref 70–99)
Potassium: 2.9 mmol/L — ABNORMAL LOW (ref 3.5–5.1)
Sodium: 133 mmol/L — ABNORMAL LOW (ref 135–145)
Total Bilirubin: 9.1 mg/dL — ABNORMAL HIGH (ref 0.3–1.2)
Total Protein: 5.1 g/dL — ABNORMAL LOW (ref 6.5–8.1)

## 2021-10-04 LAB — IGG: IgG (Immunoglobin G), Serum: 716 mg/dL (ref 603–1613)

## 2021-10-04 LAB — ANA W/REFLEX IF POSITIVE: Anti Nuclear Antibody (ANA): NEGATIVE

## 2021-10-04 LAB — CBC
HCT: 33.1 % — ABNORMAL LOW (ref 39.0–52.0)
Hemoglobin: 12.1 g/dL — ABNORMAL LOW (ref 13.0–17.0)
MCH: 27.7 pg (ref 26.0–34.0)
MCHC: 36.6 g/dL — ABNORMAL HIGH (ref 30.0–36.0)
MCV: 75.7 fL — ABNORMAL LOW (ref 80.0–100.0)
Platelets: 57 10*3/uL — ABNORMAL LOW (ref 150–400)
RBC: 4.37 MIL/uL (ref 4.22–5.81)
RDW: 13.1 % (ref 11.5–15.5)
WBC: 10.5 10*3/uL (ref 4.0–10.5)
nRBC: 0 % (ref 0.0–0.2)

## 2021-10-04 LAB — ANTI-SMOOTH MUSCLE ANTIBODY, IGG: F-Actin IgG: 5 Units (ref 0–19)

## 2021-10-04 LAB — MITOCHONDRIAL ANTIBODIES: Mitochondrial M2 Ab, IgG: <20 Units (ref 0.0–20.0)

## 2021-10-04 LAB — ALPHA-1-ANTITRYPSIN: A-1 Antitrypsin, Ser: 283 mg/dL — ABNORMAL HIGH (ref 95–164)

## 2021-10-04 LAB — LIPID PANEL
Cholesterol: 119 mg/dL (ref 0–200)
HDL: <10 mg/dL — ABNORMAL LOW (ref >40)
Triglycerides: 348 mg/dL — ABNORMAL HIGH (ref <150)
VLDL: 70 mg/dL — ABNORMAL HIGH (ref 0–40)

## 2021-10-04 LAB — GLUCOSE, CAPILLARY
Glucose-Capillary: 115 mg/dL — ABNORMAL HIGH (ref 70–99)
Glucose-Capillary: 132 mg/dL — ABNORMAL HIGH (ref 70–99)
Glucose-Capillary: 149 mg/dL — ABNORMAL HIGH (ref 70–99)
Glucose-Capillary: 158 mg/dL — ABNORMAL HIGH (ref 70–99)
Glucose-Capillary: 175 mg/dL — ABNORMAL HIGH (ref 70–99)
Glucose-Capillary: 182 mg/dL — ABNORMAL HIGH (ref 70–99)
Glucose-Capillary: 186 mg/dL — ABNORMAL HIGH (ref 70–99)
Glucose-Capillary: 190 mg/dL — ABNORMAL HIGH (ref 70–99)

## 2021-10-04 LAB — CERULOPLASMIN: Ceruloplasmin: 19.4 mg/dL (ref 16.0–31.0)

## 2021-10-04 MED ORDER — FERROUS SULFATE 325 (65 FE) MG PO TABS
325.0000 mg | ORAL_TABLET | Freq: Every day | ORAL | Status: DC
  Administered 2021-10-06: 325 mg via ORAL
  Filled 2021-10-04: qty 1

## 2021-10-04 MED ORDER — FERUMOXYTOL INJECTION 510 MG/17 ML
510.0000 mg | Freq: Once | INTRAVENOUS | Status: AC
  Administered 2021-10-04: 510 mg via INTRAVENOUS
  Filled 2021-10-04: qty 17

## 2021-10-04 MED ORDER — POTASSIUM CHLORIDE CRYS ER 20 MEQ PO TBCR
40.0000 meq | EXTENDED_RELEASE_TABLET | ORAL | Status: AC
  Administered 2021-10-04 (×2): 40 meq via ORAL
  Filled 2021-10-04 (×2): qty 2

## 2021-10-04 MED ORDER — LIVING WELL WITH DIABETES BOOK - IN SPANISH
Freq: Once | Status: AC
  Filled 2021-10-04: qty 1

## 2021-10-04 NOTE — Progress Notes (Addendum)
PROGRESS NOTE  Tom Merritt  DJM:[id number] DOB: 1990-06-01 DOA: 10/01/2021 PCP: Pcp, No   Brief Narrative:  Patient is a 31 year old male without any significant past medical history who presents with nausea, vomiting, diarrhea from home.  Associated decreased oral intake.  No history of fever or chills, no history of drug abuse.  He was febrile on presentation, tachycardic, hypotensive.  Lab work showed sodium of 114, potassium of 3.2, creatinine of 1.5, elevated lactate elevated bilirubin, leukocytosis, elevated procalcitonin.  Found to be significantly dehydrated on presentation with sodium of less than 10 and urine.  Chest x-ray did not show any acute cardiopulmonary process.  CT abdomen/pelvis showed, no other but no other evidence of acute intra-abdominal or intrapelvic process.  COVID PCR negative.  C. difficile negative. patient was suspected to have sepsis of unknown etiology and started on broad antibiotics, IV fluids.   GI also consulted for persistent elevation in the bilirubin.  Overall condition significantly improved.  Assessment & Plan:  Principal Problem:   Severe sepsis (HCC) Active Problems:   High anion gap metabolic acidosis   Lactic acidosis   AKI (acute kidney injury) (HCC)   Transaminitis   Hyperglycemia   Hypokalemia   Acute hyponatremia  Suspected severe sepsis: Unknown etiology.  Presented with fever, tachycardia, tachypnea, leukocytosis.  Presented with persistent nausea, vomiting, diarrhea.  CT abdomen/pelvis does not show any acute intra-abdominal/pelvic pathology.  Elevated lactic acid. Elevated procalcitonin.  Since cultures have remained negative and he is afebrile, will discontinue antibiotics.  Sepsis ruled out.  Diarrhea /vomiting: C. difficile negative.  Negative GI pathogen panel.    Started on Imodium.  Diarrhea has stopped, will stop IV fluids  AKI/anion gap metabolic acidosis: Associated with diarrhea, vomiting.   Resolved  Hyponatremia/hypokalemia/hypomagnesemia hypocalcemia: Likely from hypovolemic hyponatremia associated with fluid loss along with electrolytes.    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 Acute hepatitis panel negative, as well as HIV.  Negative right upper quadrant ultrasound except for fatty liver.  Tylenol level negative.    Patient is  occasional drinker, denies binge drinking.  GI consulted.  Sent autoimmune panel, pending work-up. Has iron-deficiency, supplemented with IV iron  Uncontrolled diabetes type 2 with hyperglycemia: Continue sliding scale insulin/Lantus.  Monitor blood sugars.  Hemoglobin A1c level of 10.  Diabetic coordinator consulted.  He will need insulin on discharge.  Not on any medications at home.  Needs insulin education  Hyperlipidemia: Elevated triglyceride.  LDL less than 10.  LDL not calculated.  We will put him on fenofibrate or Lipitor after further improvement in the liver enzymes/bilirubin.  Uninsured: TOC consulted      DVT prophylaxis:SCDs Start: 10/01/21 2303     Code Status: Full Code  Family Communication: None at bedside  Patient status: Inpatient  Patient is from : Home  Anticipated discharge to: Home  Estimated DC date: 1-2 days   Consultants: None  Procedures: None  Antimicrobials:  Anti-infectives (From admission, onward)    Start     Dose/Rate Route Frequency Ordered Stop   10/02/21 1000  metroNIDAZOLE (FLAGYL) IVPB 500 mg  Status:  Discontinued        500 mg 100 mL/hr over 60 Minutes Intravenous Every 12 hours 10/01/21 2307 10/04/21 0823   10/02/21 0946  vancomycin (VANCOCIN) IVPB 1000 mg/200 mL premix  Status:  Discontinued        1,000 mg 200 mL/hr over 60 Minutes Intravenous Every 12 hours 10/02/21 0829 10/04/21  1610   10/02/21 0500  ceFEPIme (MAXIPIME) 2 g in sodium chloride 0.9 % 100 mL IVPB  Status:  Discontinued        2  g 200 mL/hr over 30 Minutes Intravenous Every 8 hours 10/01/21 2101 10/04/21 0823   10/01/21 2101  vancomycin (VANCOREADY) IVPB 1750 mg/350 mL  Status:  Discontinued        1,750 mg 175 mL/hr over 120 Minutes Intravenous Every 24 hours 10/01/21 2101 10/02/21 0829   10/01/21 1945  ceFEPIme (MAXIPIME) 2 g in sodium chloride 0.9 % 100 mL IVPB        2 g 200 mL/hr over 30 Minutes Intravenous  Once 10/01/21 1936 10/01/21 2140   10/01/21 1945  metroNIDAZOLE (FLAGYL) IVPB 500 mg        500 mg 100 mL/hr over 60 Minutes Intravenous  Once 10/01/21 1936 10/01/21 2140   10/01/21 1945  vancomycin (VANCOCIN) IVPB 1000 mg/200 mL premix  Status:  Discontinued        1,000 mg 200 mL/hr over 60 Minutes Intravenous  Once 10/01/21 1936 10/01/21 1942   10/01/21 1945  vancomycin (VANCOREADY) IVPB 1750 mg/350 mL  Status:  Discontinued        1,750 mg 175 mL/hr over 120 Minutes Intravenous  Once 10/01/21 1942 10/01/21 2101       Subjective: Patient seen and examined at the bedside this morning.  Hemodynamically stable today.  Sinus tachycardia has resolved.  He feels a lot better.  Diarrhea has stopped.  He complains of some edema of the lower extremities.  Objective: Vitals:   10/03/21 1937 10/03/21 2332 10/04/21 0354 10/04/21 0742  BP: (!) 92/54 102/66 (!) 90/50 101/66  Pulse: 100 97 88 87  Resp: (!) 27 (!) 34 (!) 27 20  Temp: 98.2 F (36.8 C) 97.7 F (36.5 C) 97.7 F (36.5 C) 97.8 F (36.6 C)  TempSrc: Oral Oral Oral Oral  SpO2: 94%   96%  Weight:      Height:       No intake or output data in the 24 hours ending 10/04/21 1051  Filed Weights   10/01/21 1929 10/02/21 1505 10/03/21 0348  Weight: 81.6 kg 104.7 kg 103.6 kg    Examination:  General exam: Overall comfortable, not in distress,obese HEENT: PERRL Respiratory system:  no wheezes or crackles  Cardiovascular system: S1 & S2 heard, RRR.  Gastrointestinal system: Abdomen is nondistended, soft and nontender. Central nervous system:  Alert and oriented Extremities: trace bilateral lower extremity edema, no clubbing ,no cyanosis Skin: No rashes, no ulcers,no icterus     Data Reviewed: I have personally reviewed following labs and imaging studies  CBC: Recent Labs  Lab 10/01/21 1745 10/02/21 0407 10/03/21 0152 10/04/21 0422  WBC 15.3* 11.1* 14.6* 10.5  NEUTROABS 14.6* 10.6*  --   --   HGB 13.9 12.7* 11.9* 12.1*  HCT 37.8* 35.0* 32.2* 33.1*  MCV 75.3* 75.8* 75.9* 75.7*  PLT PLATELET CLUMPS NOTED ON SMEAR, UNABLE TO ESTIMATE 41* 44* 57*   Basic Metabolic Panel: Recent Labs  Lab 10/01/21 2315 10/02/21 0407 10/02/21 0840 10/03/21 0152 10/04/21 0422  NA 117* 124* 126* 132* 133*  K 3.1* 3.4* 3.5 2.9* 2.9*  CL 86* 92* 97* 101 96*  CO2 16* 16* 15* 20* 26  GLUCOSE 268* 221* 171* 163* 164*  BUN 36* 31* 33* 29* 14  CREATININE 1.54* 1.43* 1.62* 1.14 0.74  CALCIUM 5.8* 6.3* 6.5* 6.6* 6.8*  MG 1.0* 1.1*  --  1.6*  --  Recent Results (from the past 240 hour(s))  Culture, blood (Routine x 2)     Status: None (Preliminary result)   Collection Time: 10/01/21  5:45 PM   Specimen: BLOOD  Result Value Ref Range Status   Specimen Description BLOOD SITE NOT SPECIFIED  Final   Special Requests   Final    BOTTLES DRAWN AEROBIC AND ANAEROBIC Blood Culture adequate volume   Culture   Final    NO GROWTH 2 DAYS Performed at [facility], [address] [place], [place] [address]    Report Status PENDING  Incomplete  SARS Coronavirus 2 by RT PCR (hospital order, performed in [facility] hospital lab) *cepheid single result test* Anterior Nasal Swab     Status: None   Collection Time: 10/02/21  1:05 AM   Specimen: Anterior Nasal Swab  Result Value Ref Range Status   SARS Coronavirus 2 by RT PCR NEGATIVE NEGATIVE Final    Comment: (NOTE) SARS-CoV-2 target nucleic acids are NOT DETECTED.  The SARS-CoV-2 RNA is generally detectable in upper and lower respiratory specimens during the acute phase of infection.  The lowest concentration of SARS-CoV-2 viral copies this assay can detect is 250 copies / mL. A negative result does not preclude SARS-CoV-2 infection and should not be used as the sole basis for treatment or other patient management decisions.  A negative result may occur with improper specimen collection / handling, submission of specimen other than nasopharyngeal swab, presence of viral mutation(s) within the areas targeted by this assay, and inadequate number of viral copies (<250 copies / mL). A negative result must be combined with clinical observations, patient history, and epidemiological information.  Fact Sheet for Patients:   RoadLapTop.co.za  Fact Sheet for Healthcare Providers: http://kim-miller.com/  This test is not yet approved or  cleared by the [place] FDA and has been authorized for detection and/or diagnosis of SARS-CoV-2 by FDA under an Emergency Use Authorization (EUA).  This EUA will remain in effect (meaning this test can be used) for the duration of the COVID-19 declaration under Section 564(b)(1) of the Act, 21 U.S.C. section 360bbb-3(b)(1), unless the authorization is terminated or revoked sooner.  Performed at [facility], [address], [place], [place] [address]   Gastrointestinal Panel by PCR , Stool     Status: None   Collection Time: 10/02/21  5:30 AM   Specimen: Stool  Result Value Ref Range Status   Campylobacter species NOT DETECTED NOT DETECTED Final   Plesimonas shigelloides NOT DETECTED NOT DETECTED Final   Salmonella species NOT DETECTED NOT DETECTED Final   Yersinia enterocolitica NOT DETECTED NOT DETECTED Final   Vibrio species NOT DETECTED NOT DETECTED Final   Vibrio cholerae NOT DETECTED NOT DETECTED Final   Enteroaggregative E coli (EAEC) NOT DETECTED NOT DETECTED Final   Enteropathogenic E coli (EPEC) NOT DETECTED NOT DETECTED Final   Enterotoxigenic E coli (ETEC) NOT  DETECTED NOT DETECTED Final   Shiga like toxin producing E coli (STEC) NOT DETECTED NOT DETECTED Final   Shigella/Enteroinvasive E coli (EIEC) NOT DETECTED NOT DETECTED Final   Cryptosporidium NOT DETECTED NOT DETECTED Final   Cyclospora cayetanensis NOT DETECTED NOT DETECTED Final   Entamoeba histolytica NOT DETECTED NOT DETECTED Final   Giardia lamblia NOT DETECTED NOT DETECTED Final   Adenovirus F40/41 NOT DETECTED NOT DETECTED Final   Astrovirus NOT DETECTED NOT DETECTED Final   Norovirus GI/GII NOT DETECTED NOT DETECTED Final   Rotavirus A NOT DETECTED NOT  DETECTED Final   Sapovirus (I, II, IV, and V) NOT DETECTED NOT DETECTED Final    Comment: Performed at [facility], [address] [place], [place] [address]  C Difficile Quick Screen w PCR reflex     Status: None   Collection Time: 10/02/21  5:30 AM   Specimen: Stool  Result Value Ref Range Status   C Diff antigen NEGATIVE NEGATIVE Final   C Diff toxin NEGATIVE NEGATIVE Final   C Diff interpretation No C. difficile detected.  Final    Comment: Performed at [facility], [address] [place], [place] [address]  MRSA Next Gen by PCR, Nasal     Status: None   Collection Time: 10/02/21  1:50 PM   Specimen: Nasal Mucosa; Nasal Swab  Result Value Ref Range Status   MRSA by PCR Next Gen NOT DETECTED NOT DETECTED Final    Comment: (NOTE) The GeneXpert MRSA Assay (FDA approved for NASAL specimens only), is one component of a comprehensive MRSA colonization surveillance program. It is not intended to diagnose MRSA infection nor to guide or monitor treatment for MRSA infections. Test performance is not FDA approved in patients less than 77 years old. Performed at [facility], [address], [place], [place] [address]      Radiology Studies: No results found.  Scheduled Meds:  [START ON 10/05/2021] ferrous sulfate  325 mg Oral Q breakfast   insulin aspart  0-9 Units Subcutaneous Q4H   insulin  glargine-yfgn  10 Units Subcutaneous Daily   potassium chloride  40 mEq Oral Q1 Hr x 2   Continuous Infusions:     LOS: 3 days   Burnadette Pop, MD [facility] P7/07/2021, 10:51 AM

## 2021-10-04 NOTE — Progress Notes (Addendum)
Daily Rounding Note  10/04/2021, 11:57 AM  LOS: 3 days   SUBJECTIVE:   Chief complaint:   elevated LFTs   Pa\in in throat, odynophagia to anything other than very bland foods.  He attributes to protracted N/V of a few days ago.   No nausea.  No abd pain.    OBJECTIVE:         Vital signs in last 24 hours:    Temp:  [97.6 F (36.4 C)-98.2 F (36.8 C)] 97.7 F (36.5 C) (07/05 1100) Pulse Rate:  [87-100] 87 (07/05 0742) Resp:  [20-34] 25 (07/05 1100) BP: (90-113)/(50-75) 102/68 (07/05 1100) SpO2:  [94 %-97 %] 97 % (07/05 1100) Last BM Date : 10/03/21 Filed Weights   10/01/21 1929 10/02/21 1505 10/03/21 0348  Weight: 81.6 kg 104.7 kg 103.6 kg   General: obese, NAD, comfortable   OP: mucosa is moist, pink, clear.  No lesions or exudates Heart: RRR Chest: clear bil.   Abdomen: soft, NT, ND.  Active BS.    Extremities: trace LE edema Derm: fresh tattoo on upper R arm, no signs infection Neuro/Psych:  pleasant, calm, fluid speech, no gross weakness or deficits.     Lab Results: Recent Labs    10/02/21 0407 10/03/21 0152 10/04/21 0422  WBC 11.1* 14.6* 10.5  HGB 12.7* 11.9* 12.1*  HCT 35.0* 32.2* 33.1*  PLT 41* 44* 57*   BMET Recent Labs    10/02/21 0840 10/03/21 0152 10/04/21 0422  NA 126* 132* 133*  K 3.5 2.9* 2.9*  CL 97* 101 96*  CO2 15* 20* 26  GLUCOSE 171* 163* 164*  BUN 33* 29* 14  CREATININE 1.62* 1.14 0.74  CALCIUM 6.5* 6.6* 6.8*   LFT Recent Labs    10/01/21 2315 10/02/21 0407 10/03/21 0152 10/04/21 0422  PROT  --  5.4* 5.1* 5.1*  ALBUMIN  --  2.2* 2.0* 1.8*  AST  --  49* 52* 90*  ALT  --  54* 50* 53*  ALKPHOS  --  67 81 98  BILITOT  --  10.9* 10.7* 9.1*  BILIDIR 7.2*  --   --   --    PT/INR Recent Labs    10/01/21 1745 10/02/21 0407  LABPROT 16.7* 16.6*  INR 1.4* 1.4*   Hepatitis Panel Recent Labs    10/01/21 2315  HEPBSAG NON REACTIVE  HCVAB NON REACTIVE  HEPAIGM  NON REACTIVE  HEPBIGM NON REACTIVE    Studies/Results: No results found.  Scheduled Meds:  [START ON 10/05/2021] ferrous sulfate  325 mg Oral Q breakfast   insulin aspart  0-9 Units Subcutaneous Q4H   insulin glargine-yfgn  10 Units Subcutaneous Daily   Continuous Infusions: PRN Meds:.acetaminophen **OR** acetaminophen, dextrose, loperamide, ondansetron (ZOFRAN) IV, white petrolatum  ASSESMENT:   Elevated LFTs of unclear etiology.  Possibly due to sepsis in setting of acute but resolved N/V/D.  Acute hepatitis serologies negative.  Fatty liver on imaging but no ductal obstruction.  Suspect combination of diet and alcohol.   Serologic evaluation in progress.  Results thus far are negative ANA, ferritin 1225.  T. bili (primarily direct) trending down.  Alk phos consistently normal.  Transaminases trending up.  New onset diabetes.  Diabetes coordinator suggesting metformin and Amaryl at discharge  Minor anemia with microcytosis.  Low iron, low TIBC, low iron sats.  Ferritin 1225.  Evaded vitamin B12.  Normal folate.  Note hospitalists initiation oral iron daily and single infusion Feraheme today.  Hyponatremia.  Na 114.. 133.  Hypokalemia.  K 2.9.    Thrombocytopenia - pathologist smear review pending  PLAN     Await results of multiple pndg labs, trend LFTs.      Azucena Freed  10/04/2021, 11:57 AM Phone [phone]   GI Attending:  Etiology of abnl LFTs not entirely clear. Likely multifactorial - acute illness effects (dehydration, possible hypotension, sepsis syndrome,fatty liver) Autoimmune tests negative. Ceruloplasmin and alpha-1 antitrypsin pending.  Though not only issue he should not drink EtOH  Will f/u  Gatha Mayer, MD, [facility] See Shea Evans on call - gastroenterology for best contact person 10/04/2021 3:11 PM

## 2021-10-04 NOTE — Progress Notes (Signed)
   10/04/21 1400  Clinical Encounter Type  Visited With Patient and family together  Visit Type Initial  Referral From Physician;Nurse Shelly Coss, MD; Shanon Ace, RN)  Consult/Referral To Chaplain Melvenia Beam)  Recommendations Advance Directive   Chaplain met Tom Merritt and family at patient's bedside to provide Advance Directive Education. Patient stated that another chaplain had already been there to discuss [place] and Living Will with him earlier today, but patient did not have materials. Patient was not interested in discussing at this time. Chaplain left both Medical and Mental Health Advance Directive materials in both [place] and Spanish translations for the patient to review. If patient desires further conversation or instruction please page chaplain.  [address] [place], [place]., [phone]

## 2021-10-04 NOTE — Inpatient Diabetes Management (Addendum)
Inpatient Diabetes Program Recommendations  AACE/ADA: New Consensus Statement on Inpatient Glycemic Control (2015)  Target Ranges:  Prepandial:   less than 140 mg/dL      Peak postprandial:   less than 180 mg/dL (1-2 hours)      Critically ill patients:  140 - 180 mg/dL   Lab Results  Component Value Date   GLUCAP 175 (H) 10/04/2021   HGBA1C 10.6 (H) 10/01/2021    Review of Glycemic Control  Diabetes history: New DM2 Outpatient Diabetes medications: None Current orders for Inpatient glycemic control: Novolog 0-9 units Q4H, Semglee 10 units QD  Inpatient Diabetes Program Recommendations:    For DC might consider:  Metformin 500 mg BID Amaryl 4 mg QD F/U with PCP  Check CBGs at home fasting and 2-3 hrs after lunch   Spoke with patient at bedside.  Spoke with pt about new diagnosis. Discussed A1C results with him and explained what an A1C is, basic pathophysiology of DM Type 2, basic home care, basic diabetes diet nutrition principles, importance of checking CBGs and maintaining good CBG control to prevent long-term and short-term complications. Reviewed signs and symptoms of hyperglycemia and hypoglycemia and how to treat hypoglycemia at home. Also reviewed blood sugar goals at home.   RNs to provide ongoing basic DM education at bedside with this patient. Have ordered educational booklet, insulin starter kit, and DM videos. Have also placed RD consult for DM diet education for this patient.   Educated on The Plate Method, CHO's, portion control, CBGs at home fasting and mid afternoon, F/U with PCP every 3 months, bring meter to PCP office, long and short term complications of uncontrolled BG, and importance of exercise.  Will provide him with a glucometer as he is uninsured.  Consult TOC for PCP needs.    He has significant room for improvement of glucose with diet changes and exercise.  He states he eats "a lot of junk".  Does not drink sodas.  Educated on eliminating caloric  beverages.  He has a Building services engineer.  Explained impact of exercise on glucose.    Will continue to follow while inpatient.  Thank you, Tom Dixon, MSN, [place] Diabetes Coordinator Inpatient Diabetes Program [phone] (team pager from 8a-5p)

## 2021-10-04 NOTE — Plan of Care (Signed)
  Problem: Skin Integrity: Goal: Risk for impaired skin integrity will decrease Outcome: Progressing   Problem: Education: Goal: Knowledge of General Education information will improve Description: Including pain rating scale, medication(s)/side effects and non-pharmacologic comfort measures Outcome: Progressing   Problem: Clinical Measurements: Goal: Respiratory complications will improve Outcome: Progressing   Problem: Activity: Goal: Risk for activity intolerance will decrease Outcome: Progressing   Problem: Coping: Goal: Level of anxiety will decrease Outcome: Progressing   Problem: Elimination: Goal: Will not experience complications related to bowel motility Outcome: Progressing   Problem: Fluid Volume: Goal: Ability to maintain a balanced intake and output will improve Outcome: Not Progressing   Problem: Nutritional: Goal: Maintenance of adequate nutrition will improve Outcome: Not Progressing   Problem: Nutrition: Goal: Adequate nutrition will be maintained Outcome: Not Progressing

## 2021-10-05 LAB — CBC
HCT: 36 % — ABNORMAL LOW (ref 39.0–52.0)
Hemoglobin: 13 g/dL (ref 13.0–17.0)
MCH: 28 pg (ref 26.0–34.0)
MCHC: 36.1 g/dL — ABNORMAL HIGH (ref 30.0–36.0)
MCV: 77.6 fL — ABNORMAL LOW (ref 80.0–100.0)
Platelets: 92 10*3/uL — ABNORMAL LOW (ref 150–400)
RBC: 4.64 MIL/uL (ref 4.22–5.81)
RDW: 13.2 % (ref 11.5–15.5)
WBC: 16.1 10*3/uL — ABNORMAL HIGH (ref 4.0–10.5)
nRBC: 0.4 % — ABNORMAL HIGH (ref 0.0–0.2)

## 2021-10-05 LAB — COMPREHENSIVE METABOLIC PANEL
ALT: 62 U/L — ABNORMAL HIGH (ref 0–44)
AST: 73 U/L — ABNORMAL HIGH (ref 15–41)
Albumin: 2.1 g/dL — ABNORMAL LOW (ref 3.5–5.0)
Alkaline Phosphatase: 136 U/L — ABNORMAL HIGH (ref 38–126)
Anion gap: 13 (ref 5–15)
BUN: 11 mg/dL (ref 6–20)
CO2: 24 mmol/L (ref 22–32)
Calcium: 7.3 mg/dL — ABNORMAL LOW (ref 8.9–10.3)
Chloride: 95 mmol/L — ABNORMAL LOW (ref 98–111)
Creatinine, Ser: 0.7 mg/dL (ref 0.61–1.24)
GFR, Estimated: >60 mL/min (ref >60)
Glucose, Bld: 111 mg/dL — ABNORMAL HIGH (ref 70–99)
Potassium: 3.4 mmol/L — ABNORMAL LOW (ref 3.5–5.1)
Sodium: 132 mmol/L — ABNORMAL LOW (ref 135–145)
Total Bilirubin: 8.4 mg/dL — ABNORMAL HIGH (ref 0.3–1.2)
Total Protein: 5.8 g/dL — ABNORMAL LOW (ref 6.5–8.1)

## 2021-10-05 LAB — GLUCOSE, CAPILLARY
Glucose-Capillary: 103 mg/dL — ABNORMAL HIGH (ref 70–99)
Glucose-Capillary: 108 mg/dL — ABNORMAL HIGH (ref 70–99)
Glucose-Capillary: 124 mg/dL — ABNORMAL HIGH (ref 70–99)
Glucose-Capillary: 139 mg/dL — ABNORMAL HIGH (ref 70–99)
Glucose-Capillary: 189 mg/dL — ABNORMAL HIGH (ref 70–99)
Glucose-Capillary: 99 mg/dL (ref 70–99)

## 2021-10-05 LAB — PATHOLOGIST SMEAR REVIEW: Path Review: NEGATIVE

## 2021-10-05 MED ORDER — LIVING WELL WITH DIABETES BOOK - IN SPANISH
Freq: Once | Status: AC
Start: 2021-10-05 — End: 2021-10-05
  Filled 2021-10-05: qty 1

## 2021-10-05 MED ORDER — FUROSEMIDE 10 MG/ML IJ SOLN
40.0000 mg | Freq: Once | INTRAMUSCULAR | Status: AC
Start: 2021-10-05 — End: 2021-10-05
  Administered 2021-10-05: 40 mg via INTRAVENOUS
  Filled 2021-10-05: qty 4

## 2021-10-05 MED ORDER — POTASSIUM CHLORIDE CRYS ER 20 MEQ PO TBCR
40.0000 meq | EXTENDED_RELEASE_TABLET | ORAL | Status: AC
  Administered 2021-10-05 (×2): 40 meq via ORAL
  Filled 2021-10-05 (×2): qty 2

## 2021-10-05 MED ORDER — ORAL CARE MOUTH RINSE
15.0000 mL | OROMUCOSAL | Status: DC | PRN
Start: 2021-10-05 — End: 2021-10-06

## 2021-10-05 NOTE — Progress Notes (Addendum)
Progress Note   Subjective  Chief Complaint: Elevated LFTs  This morning, patient is sitting comfortably in bed.  Tells me that the hospitalist was going to let him go home today but told him he like him to stay 1 more night.  Denies any further nausea or vomiting.  Apparently able to tolerate a diet yesterday.  No abdominal pain.  Eager to go home.   Objective   Vital signs in last 24 hours: Temp:  [97.5 F (36.4 C)-98.1 F (36.7 C)] 98.1 F (36.7 C) (07/06 0720) Pulse Rate:  [73-90] 73 (07/06 0406) Resp:  [16-25] 19 (07/06 0720) BP: (85-126)/(57-98) 126/98 (07/06 0720) SpO2:  [95 %-97 %] 97 % (07/06 0720) Last BM Date : 10/03/21 General:  Hispanic male in NAD Heart:  Regular rate and rhythm; no murmurs Lungs: Respirations even and unlabored, lungs CTA bilaterally Abdomen:  Soft, nontender and nondistended. Normal bowel sounds. Psych:  Cooperative. Normal mood and affect.  Intake/Output from previous day: 07/05 0701 - 07/06 0700 In: 720 [P.O.:720] Out: -   Lab Results: Recent Labs    10/03/21 0152 10/04/21 0422 10/05/21 0044  WBC 14.6* 10.5 16.1*  HGB 11.9* 12.1* 13.0  HCT 32.2* 33.1* 36.0*  PLT 44* 57* 92*   BMET Recent Labs    10/03/21 0152 10/04/21 0422 10/05/21 0044  NA 132* 133* 132*  K 2.9* 2.9* 3.4*  CL 101 96* 95*  CO2 20* 26 24  GLUCOSE 163* 164* 111*  BUN 29* 14 11  CREATININE 1.14 0.74 0.70  CALCIUM 6.6* 6.8* 7.3*      Latest Ref Rng & Units 10/05/2021   12:44 AM 10/04/2021    4:22 AM 10/03/2021    1:52 AM  Hepatic Function  Total Protein 6.5 - 8.1 g/dL 5.8  5.1  5.1   Albumin 3.5 - 5.0 g/dL 2.1  1.8  2.0   AST 15 - 41 U/L 73  90  52   ALT 0 - 44 U/L 62  53  50   Alk Phosphatase 38 - 126 U/L 136  98  81   Total Bilirubin 0.3 - 1.2 mg/dL 8.4  9.1  92.6       Assessment / Plan:   Assessment: 1.  Elevated LFTs: Thought possibly due to sepsis in setting of acute but resolved nausea vomiting and diarrhea, acute hepatitis serologies  negative, fatty liver on imaging but no ductal obstruction, ANA negative, ferritin 1225, T. bili trending down, alk phos elevated ALT today from 98 to 136, IgG normal, alpha-1 antitrypsin elevated at 283, ceruloplasmin normal, ASMA normal, AMA normal 2.  New onset diabetes 3.  Hyponatremia 4.  Hypokalemia  Plan: 1.  Again etiology with normal LFTs not totally clear, likely multifactorial in the setting of acute illness 2.  Discussed with patient that he should discontinue alcohol use 3.  If patient is discharged over the next 24 to 48 hours will need to monitor LFTs going forward to ensure they return to normal  Thank you for your kind consultation, we will continue to follow along during this hospitalization.    LOS: 4 days   Unk Lightning  10/05/2021, 10:59 AM  Agree with physician assessment evaluation and plan. He should have LFTs rechecked at new primary care clinic and we can also arrange for an office visit with [place] in follow-up after that (August most likely).  Iva Boop, MD, [facility] See Loretha Stapler on call - gastroenterology for best contact  person 10/05/2021 5:13 PM

## 2021-10-05 NOTE — TOC Progression Note (Addendum)
Transition of Care [facility]) - Progression Note    Patient Details  Name: Tom Merritt MRN: [id number] Date of Birth: 1990-11-23  Transition of Care [facility]) CM/SW Contact  Beckie Busing, RN Phone Number:[phone]  10/05/2021, 9:35 AM  Clinical Narrative:    TOC consulted for patient with no insurance or PCP. MATCH has been completed to provide patient with 30 day supply of meds. MD has been made aware and scripts will be sent to [facility] pharmacy. TOC pharmacy will fill and deliver to bedside prior to discharge.   8003 Follow up appointment to establish PCP has been added to AVS. Patient is to follow up at [facility] on Friday October 20, 2021 at 0930 with Gwinda Passe.         Expected Discharge Plan and Services                                                 Social Determinants of Health (SDOH) Interventions    Readmission Risk Interventions     No data to display

## 2021-10-05 NOTE — Inpatient Diabetes Management (Signed)
Inpatient Diabetes Program Recommendations  AACE/ADA: New Consensus Statement on Inpatient Glycemic Control (2015)  Target Ranges:  Prepandial:   less than 140 mg/dL      Peak postprandial:   less than 180 mg/dL (1-2 hours)      Critically ill patients:  140 - 180 mg/dL   Lab Results  Component Value Date   GLUCAP 189 (H) 10/05/2021   HGBA1C 10.6 (H) 10/01/2021   Inpatient Diabetes Program Recommendations:   Reviewed Relion glucometer and supplies with patient and patient acknowledged understanding to check CBGs. Ordered Living Well With Diabetes booklet in Spanish for review.  Thank you, Billy Fischer. Jaymee Tilson, RN, MSN, CDE  Diabetes Coordinator Inpatient Glycemic Control Team Team Pager [phone] (8am-5pm) 10/05/2021 11:54 AM

## 2021-10-05 NOTE — Evaluation (Signed)
Physical Therapy Evaluation & Discharge Patient Details Name: Tom Merritt MRN: [id number] DOB: 03-13-1991 Today's Date: 10/05/2021  History of Present Illness  Pt is a 31 y.o. male admitted 10/01/21 with nauea, vomiting, diarrhea associated with decreased oral intake. Elevated LFTs of unclear etiology; per gastrology, suspect combination of diet and ETOH use. Also with new dx of diabetes this admission. No PMH on file.   Clinical Impression  Patient evaluated by Physical Therapy with no further acute PT needs identified. PTA, pt independent, working, drives, lives with family. Today, pt independent with mobility; noted bilateral foot redness and swelling, pt endorses feeling "pins and needles." Educ on importance of daily foot skin checks if decreased sensation persists. All education has been completed and the patient has no further questions. Acute PT is signing off. Thank you for this referral.    Recommendations for follow up therapy are one component of a multi-disciplinary discharge planning process, led by the attending physician.  Recommendations may be updated based on patient status, additional functional criteria and insurance authorization.  Follow Up Recommendations No PT follow up      Assistance Recommended at Discharge None  Patient can return home with the following   N/A    Equipment Recommendations None recommended by PT  Recommendations for Other Services   N/A   Functional Status Assessment Patient has not had a recent decline in their functional status     Precautions / Restrictions Precautions Precautions: None Restrictions Weight Bearing Restrictions: No      Mobility  Bed Mobility Overal bed mobility: Independent                  Transfers Overall transfer level: Independent                      Ambulation/Gait Ambulation/Gait assistance: Independent Gait Distance (Feet): 240 Feet Assistive device: None Gait  Pattern/deviations: Step-through pattern, Decreased stride length       General Gait Details: slow, steady gait indep without DME; pt with noted feet swelling, requiring a few breaks to fix slippers falling off feet due to too small  Stairs            Wheelchair Mobility    Modified Rankin (Stroke Patients Only)       Balance Overall balance assessment: No apparent balance deficits (not formally assessed)                                           Pertinent Vitals/Pain Pain Assessment Pain Assessment: Faces Faces Pain Scale: Hurts a little bit Pain Location: bilateral feet Pain Descriptors / Indicators: Tingling Pain Intervention(s): Monitored during session    Home Living Family/patient expects to be discharged to:: Private residence Living Arrangements: Parent;Other relatives Available Help at Discharge: Family;Available PRN/intermittently Type of Home: House Home Access: Stairs to enter Entrance Stairs-Rails: Right Entrance Stairs-Number of Steps: 2   Home Layout: One level        Prior Function Prior Level of Function : Independent/Modified Independent;Driving;Working/employed             Mobility Comments: independent without DME, works on Nurse, adult        Extremity/Trunk Assessment   Upper Extremity Assessment Upper Extremity Assessment: Overall WFL for tasks assessed    Lower Extremity Assessment Lower Extremity Assessment: Overall WFL for  tasks assessed (noted bilateral feet swelling and redness, pt notes tingling)    Cervical / Trunk Assessment Cervical / Trunk Assessment: Normal  Communication   Communication: No difficulties  Cognition Arousal/Alertness: Awake/alert Behavior During Therapy: WFL for tasks assessed/performed Overall Cognitive Status: Within Functional Limits for tasks assessed                                          General Comments General comments (skin  integrity, edema, etc.): pt aware of new dx of diabetes - educ on importance of checking feet daily for skin integrity due to potential for neuropathy/decreased sensation (pt reports currently feeling "pins and needles")    Exercises     Assessment/Plan    PT Assessment Patient does not need any further PT services  PT Problem List         PT Treatment Interventions      PT Goals (Current goals can be found in the Care Plan section)  Acute Rehab PT Goals PT Goal Formulation: All assessment and education complete, DC therapy    Frequency       Co-evaluation               AM-PAC PT "6 Clicks" Mobility  Outcome Measure Help needed turning from your back to your side while in a flat bed without using bedrails?: None Help needed moving from lying on your back to sitting on the side of a flat bed without using bedrails?: None Help needed moving to and from a bed to a chair (including a wheelchair)?: None Help needed standing up from a chair using your arms (e.g., wheelchair or bedside chair)?: None Help needed to walk in hospital room?: None Help needed climbing 3-5 steps with a railing? : None 6 Click Score: 24    End of Session   Activity Tolerance: Patient tolerated treatment well Patient left: in bed;with call bell/phone within reach;with family/visitor present Nurse Communication: Mobility status PT Visit Diagnosis: Other abnormalities of gait and mobility (R26.89)    Time: 1062-6948 PT Time Calculation (min) (ACUTE ONLY): 10 min   Charges:   PT Evaluation $PT Eval Low Complexity: 1 Low        Ina Homes, PT, DPT Acute Rehabilitation Services  Personal: Secure Chat Rehab Office: [phone]  Malachy Chamber 10/05/2021, 10:01 AM

## 2021-10-05 NOTE — Progress Notes (Addendum)
PROGRESS NOTE  Tom Merritt  SAY:[id number] DOB: 28-Nov-1990 DOA: 10/01/2021 PCP: Pcp, No   Brief Narrative:  Patient is a 31 year old male without any significant past medical history who presents with nausea, vomiting, diarrhea from home.  Associated decreased oral intake.  No history of fever or chills, no history of drug abuse.  He was febrile on presentation, tachycardic, hypotensive.  Lab work showed sodium of 114, potassium of 3.2, creatinine of 1.5, elevated lactate elevated bilirubin, leukocytosis, elevated procalcitonin.  Found to be significantly dehydrated on presentation with sodium of less than 10 and urine.  Chest x-ray did not show any acute cardiopulmonary process.  CT abdomen/pelvis showed, no other but no other evidence of acute intra-abdominal or intrapelvic process.  COVID PCR negative.  C. difficile negative. patient was suspected to have sepsis of unknown etiology and started on broad antibiotics, IV fluids.   GI also consulted for persistent elevation in the bilirubin.  Overall condition significantly improved.  Plan for DC tomorrow  Assessment & Plan:  Principal Problem:   Severe sepsis (HCC) Active Problems:   High anion gap metabolic acidosis   Lactic acidosis   AKI (acute kidney injury) (HCC)   Transaminitis   Hyperglycemia   Hypokalemia   Acute hyponatremia   Abnormal LFTs  Suspected severe sepsis: Unknown etiology.  Presented with fever, tachycardia, tachypnea, leukocytosis.  Presented with persistent nausea, vomiting, diarrhea.  CT abdomen/pelvis does not show any acute intra-abdominal/pelvic pathology.  Elevated lactic acid. Elevated procalcitonin.  Since cultures have remained negative and he is afebrile, we discontinued antibiotics.  Sepsis ruled out. He developed leukocytosis today, will check CBC tomorrow.  Diarrhea /vomiting: C. difficile negative.  Negative GI pathogen panel.    Started on Imodium.  Diarrhea has stopped, IV fluids  stopped  AKI/anion gap metabolic acidosis: Associated with diarrhea, vomiting.  Resolved  Hyponatremia/hypokalemia/hypomagnesemia hypocalcemia: Likely from hypovolemic hyponatremia associated with fluid loss along with electrolytes.  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 Acute hepatitis panel negative, as well as HIV.  Negative right upper quadrant ultrasound except for fatty liver.  Tylenol level negative.    Patient is  occasional drinker, denies binge drinking.  GI consulted.  Sent autoimmune panel, most of the Rourk negative. Has iron-deficiency, supplemented with IV iron, oral iron started.  Check CMP tomorrow  Uncontrolled diabetes type 2 with hyperglycemia: Continue sliding scale insulin/Lantus.  Monitor blood sugars.  Hemoglobin A1c level of 10.  Diabetic coordinator consulted.  Recommended for oral medications on discharge  Hyperlipidemia: Elevated triglyceride.  LDL less than 10.  LDL not calculated.  She will be put on fenofibrate or Lipitor after further improvement in the liver enzymes/bilirubin.  Volume overload: Received tons of fluid due to hypotension during this hospital, now has bilateral ankle edema.  Given a dose of Lasix today.  Uninsured: TOC consulted      DVT prophylaxis:SCDs Start: 10/01/21 2303     Code Status: Full Code  Family Communication: Brother at bedside  Patient status: Inpatient  Patient is from : Home  Anticipated discharge to: Home  Estimated DC date: tomorrow   Consultants: None  Procedures: None  Antimicrobials:  Anti-infectives (From admission, onward)    Start     Dose/Rate Route Frequency Ordered Stop   10/02/21 1000  metroNIDAZOLE (FLAGYL) IVPB 500 mg  Status:  Discontinued        500 mg 100 mL/hr over 60 Minutes Intravenous Every 12 hours 10/01/21  2307 10/04/21 0823   10/02/21 0946  vancomycin (VANCOCIN) IVPB 1000 mg/200  mL premix  Status:  Discontinued        1,000 mg 200 mL/hr over 60 Minutes Intravenous Every 12 hours 10/02/21 0829 10/04/21 0823   10/02/21 0500  ceFEPIme (MAXIPIME) 2 g in sodium chloride 0.9 % 100 mL IVPB  Status:  Discontinued        2 g 200 mL/hr over 30 Minutes Intravenous Every 8 hours 10/01/21 2101 10/04/21 0823   10/01/21 2101  vancomycin (VANCOREADY) IVPB 1750 mg/350 mL  Status:  Discontinued        1,750 mg 175 mL/hr over 120 Minutes Intravenous Every 24 hours 10/01/21 2101 10/02/21 0829   10/01/21 1945  ceFEPIme (MAXIPIME) 2 g in sodium chloride 0.9 % 100 mL IVPB        2 g 200 mL/hr over 30 Minutes Intravenous  Once 10/01/21 1936 10/01/21 2140   10/01/21 1945  metroNIDAZOLE (FLAGYL) IVPB 500 mg        500 mg 100 mL/hr over 60 Minutes Intravenous  Once 10/01/21 1936 10/01/21 2140   10/01/21 1945  vancomycin (VANCOCIN) IVPB 1000 mg/200 mL premix  Status:  Discontinued        1,000 mg 200 mL/hr over 60 Minutes Intravenous  Once 10/01/21 1936 10/01/21 1942   10/01/21 1945  vancomycin (VANCOREADY) IVPB 1750 mg/350 mL  Status:  Discontinued        1,750 mg 175 mL/hr over 120 Minutes Intravenous  Once 10/01/21 1942 10/01/21 2101       Subjective: Patient seen and examined at the bedside this morning.  Very comfortable, hemodynamically stable.  Diarrhea has stopped.  He has developed severe bilateral feet edema.  Wants to take shower.  Very eager to go home but I recommend him to be monitored for 1 more day  Objective: Vitals:   10/04/21 2336 10/05/21 0406 10/05/21 0720 10/05/21 1059  BP: (!) 85/57 102/63 (!) 126/98 (!) 91/56  Pulse: 78 73    Resp: (!) 25 (!) 23 19   Temp: 98.1 F (36.7 C) 98 F (36.7 C) 98.1 F (36.7 C) 98.1 F (36.7 C)  TempSrc: Oral Oral Oral Oral  SpO2: 95% 97% 97% 96%  Weight:      Height:        Intake/Output Summary (Last 24 hours) at 10/05/2021 1114 Last data filed at 10/04/2021 1600 Gross per 24 hour  Intake 480 ml  Output --  Net 480 ml     Filed Weights   10/01/21 1929 10/02/21 1505 10/03/21 0348  Weight: 81.6 kg 104.7 kg 103.6 kg    Examination:  General exam: Overall comfortable, not in distress,obese HEENT: PERRL Respiratory system:  no wheezes or crackles  Cardiovascular system: S1 & S2 heard, RRR.  Gastrointestinal system: Abdomen is nondistended, soft and nontender. Central nervous system: Alert and oriented Extremities: B/L ankle edema, no clubbing ,no cyanosis Skin: No rashes, no ulcers,no icterus     Data Reviewed: I have personally reviewed following labs and imaging studies  CBC: Recent Labs  Lab 10/01/21 1745 10/02/21 0407 10/03/21 0152 10/04/21 0422 10/05/21 0044  WBC 15.3* 11.1* 14.6* 10.5 16.1*  NEUTROABS 14.6* 10.6*  --   --   --   HGB 13.9 12.7* 11.9* 12.1* 13.0  HCT 37.8* 35.0* 32.2* 33.1* 36.0*  MCV 75.3* 75.8* 75.9* 75.7* 77.6*  PLT PLATELET CLUMPS NOTED ON SMEAR, UNABLE TO ESTIMATE 41* 44* 57* 92*   Basic Metabolic Panel:  Recent Labs  Lab 10/01/21 2315 10/02/21 0407 10/02/21 0840 10/03/21 0152 10/04/21 0422 10/05/21 0044  NA 117* 124* 126* 132* 133* 132*  K 3.1* 3.4* 3.5 2.9* 2.9* 3.4*  CL 86* 92* 97* 101 96* 95*  CO2 16* 16* 15* 20* 26 24  GLUCOSE 268* 221* 171* 163* 164* 111*  BUN 36* 31* 33* 29* 14 11  CREATININE 1.54* 1.43* 1.62* 1.14 0.74 0.70  CALCIUM 5.8* 6.3* 6.5* 6.6* 6.8* 7.3*  MG 1.0* 1.1*  --  1.6*  --   --      Recent Results (from the past 240 hour(s))  Culture, blood (Routine x 2)     Status: None (Preliminary result)   Collection Time: 10/01/21  5:45 PM   Specimen: BLOOD  Result Value Ref Range Status   Specimen Description BLOOD SITE NOT SPECIFIED  Final   Special Requests   Final    BOTTLES DRAWN AEROBIC AND ANAEROBIC Blood Culture adequate volume   Culture   Final    NO GROWTH 3 DAYS Performed at [facility], [address], [place], [place] [address]    Report Status PENDING  Incomplete  SARS Coronavirus 2 by RT PCR (hospital  order, performed in [facility] hospital lab) *cepheid single result test* Anterior Nasal Swab     Status: None   Collection Time: 10/02/21  1:05 AM   Specimen: Anterior Nasal Swab  Result Value Ref Range Status   SARS Coronavirus 2 by RT PCR NEGATIVE NEGATIVE Final    Comment: (NOTE) SARS-CoV-2 target nucleic acids are NOT DETECTED.  The SARS-CoV-2 RNA is generally detectable in upper and lower respiratory specimens during the acute phase of infection. The lowest concentration of SARS-CoV-2 viral copies this assay can detect is 250 copies / mL. A negative result does not preclude SARS-CoV-2 infection and should not be used as the sole basis for treatment or other patient management decisions.  A negative result may occur with improper specimen collection / handling, submission of specimen other than nasopharyngeal swab, presence of viral mutation(s) within the areas targeted by this assay, and inadequate number of viral copies (<250 copies / mL). A negative result must be combined with clinical observations, patient history, and epidemiological information.  Fact Sheet for Patients:   RoadLapTop.co.za  Fact Sheet for Healthcare Providers: http://kim-miller.com/  This test is not yet approved or  cleared by the [place] FDA and has been authorized for detection and/or diagnosis of SARS-CoV-2 by FDA under an Emergency Use Authorization (EUA).  This EUA will remain in effect (meaning this test can be used) for the duration of the COVID-19 declaration under Section 564(b)(1) of the Act, 21 U.S.C. section 360bbb-3(b)(1), unless the authorization is terminated or revoked sooner.  Performed at [facility], [address], [place], [place] [address]   Gastrointestinal Panel by PCR , Stool     Status: None   Collection Time: 10/02/21  5:30 AM   Specimen: Stool  Result Value Ref Range Status   Campylobacter species NOT DETECTED  NOT DETECTED Final   Plesimonas shigelloides NOT DETECTED NOT DETECTED Final   Salmonella species NOT DETECTED NOT DETECTED Final   Yersinia enterocolitica NOT DETECTED NOT DETECTED Final   Vibrio species NOT DETECTED NOT DETECTED Final   Vibrio cholerae NOT DETECTED NOT DETECTED Final   Enteroaggregative E coli (EAEC) NOT DETECTED NOT DETECTED Final   Enteropathogenic E coli (EPEC) NOT DETECTED NOT DETECTED Final   Enterotoxigenic E coli (ETEC)  NOT DETECTED NOT DETECTED Final   Shiga like toxin producing E coli (STEC) NOT DETECTED NOT DETECTED Final   Shigella/Enteroinvasive E coli (EIEC) NOT DETECTED NOT DETECTED Final   Cryptosporidium NOT DETECTED NOT DETECTED Final   Cyclospora cayetanensis NOT DETECTED NOT DETECTED Final   Entamoeba histolytica NOT DETECTED NOT DETECTED Final   Giardia lamblia NOT DETECTED NOT DETECTED Final   Adenovirus F40/41 NOT DETECTED NOT DETECTED Final   Astrovirus NOT DETECTED NOT DETECTED Final   Norovirus GI/GII NOT DETECTED NOT DETECTED Final   Rotavirus A NOT DETECTED NOT DETECTED Final   Sapovirus (I, II, IV, and V) NOT DETECTED NOT DETECTED Final    Comment: Performed at [facility], [address] [place], [place] [address]  C Difficile Quick Screen w PCR reflex     Status: None   Collection Time: 10/02/21  5:30 AM   Specimen: Stool  Result Value Ref Range Status   C Diff antigen NEGATIVE NEGATIVE Final   C Diff toxin NEGATIVE NEGATIVE Final   C Diff interpretation No C. difficile detected.  Final    Comment: Performed at [facility], [address] [place], [place] [address]  MRSA Next Gen by PCR, Nasal     Status: None   Collection Time: 10/02/21  1:50 PM   Specimen: Nasal Mucosa; Nasal Swab  Result Value Ref Range Status   MRSA by PCR Next Gen NOT DETECTED NOT DETECTED Final    Comment: (NOTE) The GeneXpert MRSA Assay (FDA approved for NASAL specimens only), is one component of a comprehensive MRSA colonization  surveillance program. It is not intended to diagnose MRSA infection nor to guide or monitor treatment for MRSA infections. Test performance is not FDA approved in patients less than 35 years old. Performed at [facility], [address] [place], [place] [address]      Radiology Studies: No results found.  Scheduled Meds:  ferrous sulfate  325 mg Oral Q breakfast   insulin aspart  0-9 Units Subcutaneous Q4H   insulin glargine-yfgn  10 Units Subcutaneous Daily   Continuous Infusions:     LOS: 4 days   Burnadette Pop, MD [facility] P7/08/2021, 11:14 AM

## 2021-10-05 NOTE — Plan of Care (Signed)
  Problem: Coping: Goal: Ability to adjust to condition or change in health will improve Outcome: Progressing   Problem: Metabolic: Goal: Ability to maintain appropriate glucose levels will improve Outcome: Progressing   Problem: Skin Integrity: Goal: Risk for impaired skin integrity will decrease Outcome: Progressing   Problem: Clinical Measurements: Goal: Respiratory complications will improve Outcome: Progressing Goal: Cardiovascular complication will be avoided Outcome: Progressing   Problem: Activity: Goal: Risk for activity intolerance will decrease Outcome: Progressing   Problem: Coping: Goal: Level of anxiety will decrease Outcome: Progressing   Problem: Elimination: Goal: Will not experience complications related to bowel motility Outcome: Progressing   Problem: Elimination: Goal: Will not experience complications related to urinary retention Outcome: Not Progressing

## 2021-10-05 NOTE — Plan of Care (Addendum)
  RD consulted for nutrition education regarding diabetes.   Lab Results  Component Value Date   HGBA1C 10.6 (H) 10/01/2021    RD provided "Carbohydrate Counting for People with Diabetes" handout from the Academy of Nutrition and Dietetics in addition to the Plate Method handout. Discussed different food groups and their effects on blood sugar, emphasizing carbohydrate-containing foods. Provided list of carbohydrates and recommended serving sizes of common foods.  Pt wakes up around 830 am and reports he usually has a smoothie around 10am in the morning that consists of pineapple, apple, celery, cucumber and spinach. If pt is working, he reports that he sometimes does not eat lunch if he gets busy. He usually gets off work around [employer] and eats dinner around 8pm. Suggested several ways for patient to improve his diet with regards to DM management which include: 1) Adding protein to smoothie in AM-pt likes idea of adding protein powder to smoothie. Pt also likes reports he could do a protein bar.  2) Encouraged pt to set an alarm when at work to remind him to eat something 3) Encouraged pt to not go more than 4-5 hours without eating 4)Discussed options for quick meals at work that pt can bring in from home-pt has refrigerator at work that he can utilize.Pt reports the only restaurants around his work are [employer]. RD encouraged pt by stating that it is not best to eat Fast Food every day, however, if he is in a bind and unable to bring something from home that day that he can still follow the diabetic diet eating at fast food restaurants. Encouraged pt to review the nutrition facts from the fast food restaurant as they will list the carbohydrate amount in each food item; pt is aware of these and likes this plan. Also encouraged pt to refer to the standard serving sizes when out and unable to utilize food label; i.e 1 slice of bread is 1 carb serving.   Discussed importance of controlled and  consistent carbohydrate intake throughout the day. Provided examples of ways to balance meals/snacks and encouraged intake of high-fiber, whole grain complex carbohydrates. Pt mostly drinks water and encouraged pt to continue to do this. Also reviewed the food label utilizing actual items in patients room. Teach back method used.  Expect good compliance. Recommend referral for outpatient DM education as pt is newly dx; pt is agreeable to referral   If additional nutrition issues arise, please re-consult RD.  Romelle Starcher MS, RDN, LDN, CNSC Registered Dietitian III Clinical Nutrition RD Pager and On-Call Pager Number Located in [place]

## 2021-10-06 ENCOUNTER — Telehealth: Payer: Self-pay

## 2021-10-06 ENCOUNTER — Other Ambulatory Visit (HOSPITAL_COMMUNITY): Payer: Self-pay

## 2021-10-06 LAB — COMPREHENSIVE METABOLIC PANEL
ALT: 46 U/L — ABNORMAL HIGH (ref 0–44)
AST: 41 U/L (ref 15–41)
Albumin: 1.8 g/dL — ABNORMAL LOW (ref 3.5–5.0)
Alkaline Phosphatase: 144 U/L — ABNORMAL HIGH (ref 38–126)
Anion gap: 9 (ref 5–15)
BUN: 10 mg/dL (ref 6–20)
CO2: 28 mmol/L (ref 22–32)
Calcium: 7.3 mg/dL — ABNORMAL LOW (ref 8.9–10.3)
Chloride: 100 mmol/L (ref 98–111)
Creatinine, Ser: 0.72 mg/dL (ref 0.61–1.24)
GFR, Estimated: >60 mL/min (ref >60)
Glucose, Bld: 113 mg/dL — ABNORMAL HIGH (ref 70–99)
Potassium: 3.1 mmol/L — ABNORMAL LOW (ref 3.5–5.1)
Sodium: 137 mmol/L (ref 135–145)
Total Bilirubin: 4.4 mg/dL — ABNORMAL HIGH (ref 0.3–1.2)
Total Protein: 5.3 g/dL — ABNORMAL LOW (ref 6.5–8.1)

## 2021-10-06 LAB — CBC
HCT: 30.3 % — ABNORMAL LOW (ref 39.0–52.0)
Hemoglobin: 10.8 g/dL — ABNORMAL LOW (ref 13.0–17.0)
MCH: 28.1 pg (ref 26.0–34.0)
MCHC: 35.6 g/dL (ref 30.0–36.0)
MCV: 78.9 fL — ABNORMAL LOW (ref 80.0–100.0)
Platelets: 120 10*3/uL — ABNORMAL LOW (ref 150–400)
RBC: 3.84 MIL/uL — ABNORMAL LOW (ref 4.22–5.81)
RDW: 13.6 % (ref 11.5–15.5)
WBC: 17.5 10*3/uL — ABNORMAL HIGH (ref 4.0–10.5)
nRBC: 0.2 % (ref 0.0–0.2)

## 2021-10-06 LAB — CULTURE, BLOOD (ROUTINE X 2)
Culture: NO GROWTH
Special Requests: ADEQUATE

## 2021-10-06 LAB — GLUCOSE, CAPILLARY
Glucose-Capillary: 112 mg/dL — ABNORMAL HIGH (ref 70–99)
Glucose-Capillary: 110 mg/dL — ABNORMAL HIGH (ref 70–99)

## 2021-10-06 MED ORDER — POTASSIUM CHLORIDE CRYS ER 20 MEQ PO TBCR
40.0000 meq | EXTENDED_RELEASE_TABLET | ORAL | Status: AC
  Administered 2021-10-06 (×2): 40 meq via ORAL
  Filled 2021-10-06 (×2): qty 2

## 2021-10-06 MED ORDER — FERROUS SULFATE 325 (65 FE) MG PO TABS
325.0000 mg | ORAL_TABLET | Freq: Every day | ORAL | 1 refills | Status: AC
  Filled 2021-10-06: qty 30, 30d supply, fill #0

## 2021-10-06 MED ORDER — GLUCOSE BLOOD VI STRP
ORAL_STRIP | 0 refills | Status: DC
  Filled 2021-10-06: qty 100, 25d supply, fill #0

## 2021-10-06 MED ORDER — METFORMIN HCL 500 MG PO TABS
500.0000 mg | ORAL_TABLET | Freq: Two times a day (BID) | ORAL | 1 refills | Status: DC
Start: 2021-10-06 — End: 2021-10-20
  Filled 2021-10-06: qty 60, 30d supply, fill #0

## 2021-10-06 MED ORDER — AMOXICILLIN-POT CLAVULANATE 875-125 MG PO TABS
1.0000 | ORAL_TABLET | Freq: Two times a day (BID) | ORAL | 0 refills | Status: AC
  Filled 2021-10-06: qty 9, 5d supply, fill #0

## 2021-10-06 MED ORDER — ACCU-CHEK SOFTCLIX LANCETS MISC
0 refills | Status: DC
  Filled 2021-10-06: qty 100, 25d supply, fill #0

## 2021-10-06 MED ORDER — FENOFIBRATE 160 MG PO TABS
160.0000 mg | ORAL_TABLET | Freq: Every day | ORAL | 1 refills | Status: AC
  Filled 2021-10-06: qty 30, 30d supply, fill #0

## 2021-10-06 MED ORDER — POTASSIUM CHLORIDE CRYS ER 20 MEQ PO TBCR
40.0000 meq | EXTENDED_RELEASE_TABLET | Freq: Every day | ORAL | 0 refills | Status: AC
  Filled 2021-10-06: qty 14, 7d supply, fill #0

## 2021-10-06 MED ORDER — BLOOD GLUCOSE MONITOR KIT
PACK | 0 refills | Status: AC
  Filled 2021-10-06: qty 1, 30d supply, fill #0

## 2021-10-06 MED ORDER — AMOXICILLIN-POT CLAVULANATE 875-125 MG PO TABS
1.0000 | ORAL_TABLET | Freq: Two times a day (BID) | ORAL | Status: DC
  Administered 2021-10-06: 1 via ORAL
  Filled 2021-10-06: qty 1

## 2021-10-06 MED ORDER — GLIMEPIRIDE 2 MG PO TABS
4.0000 mg | ORAL_TABLET | ORAL | 1 refills | Status: DC
  Filled 2021-10-06: qty 60, 30d supply, fill #0

## 2021-10-06 MED ORDER — FENOFIBRATE 160 MG PO TABS
160.0000 mg | ORAL_TABLET | Freq: Every day | ORAL | Status: DC
  Administered 2021-10-06: 160 mg via ORAL
  Filled 2021-10-06: qty 1

## 2021-10-06 NOTE — Plan of Care (Signed)

## 2021-10-06 NOTE — Progress Notes (Addendum)
Progress Note   Subjective  Chief Complaint: Elevated LFTs  This morning, patient is sitting up on the side of his bed.  The hospitalist was in just prior to my arrival and told him that he will be going home today on 5 more days of antibiotics given his elevated white count.  He denies any new complaints or concerns and is excited to be leaving.   Objective   Vital signs in last 24 hours: Temp:  [98 F (36.7 C)-98.7 F (37.1 C)] 98.3 F (36.8 C) (07/07 0728) Pulse Rate:  [71-76] 73 (07/07 0728) Resp:  [18-20] 20 (07/07 0728) BP: (91-110)/(56-73) 110/73 (07/07 0728) SpO2:  [96 %-100 %] 100 % (07/07 0728) Weight:  [103.9 kg] 103.9 kg (07/07 0636) Last BM Date : 10/03/21 General:   Hispanic male in NAD Heart:  Regular rate and rhythm; no murmurs Lungs: Respirations even and unlabored, lungs CTA bilaterally Abdomen:  Soft, nontender and nondistended. Normal bowel sounds. Psych:  Cooperative. Normal mood and affect.  Intake/Output this shift: Total I/O In: 240 [P.O.:240] Out: -   Lab Results: Recent Labs    10/04/21 0422 10/05/21 0044 10/06/21 0324  WBC 10.5 16.1* 17.5*  HGB 12.1* 13.0 10.8*  HCT 33.1* 36.0* 30.3*  PLT 57* 92* 120*   BMET Recent Labs    10/04/21 0422 10/05/21 0044 10/06/21 0324  NA 133* 132* 137  K 2.9* 3.4* 3.1*  CL 96* 95* 100  CO2 _0 GLUCOSE 164* 111* 113*  BUN _1 CREATININE 0.74 0.70 0.72  CALCIUM 6.8* 7.3* 7.3*      Latest Ref Rng & Units 10/06/2021    3:24 AM 10/05/2021   12:44 AM 10/04/2021    4:22 AM  Hepatic Function  Total Protein 6.5 - 8.1 g/dL 5.3  5.8  5.1   Albumin 3.5 - 5.0 g/dL 1.8  2.1  1.8   AST 15 - 41 U/L 41  73  90   ALT 0 - 44 U/L 46  62  53   Alk Phosphatase 38 - 126 U/L 144  136  98   Total Bilirubin 0.3 - 1.2 mg/dL 4.4  8.4  9.1        Assessment / Plan:   Assessment: 1.  Elevated LFTs: Thought possibly due to sepsis in the setting of acute but resolved nausea, vomiting and diarrhea, acute  hepatitis serologies negative, fatty liver on imaging but no ductal obstruction, ANA negative, ferritin 1225, T. bili trending down, alk phos elevated, transaminases trending down, IgG normal, alpha-1 antitrypsin elevated at 283, ceruloplasmin normal, ASMA normal, AMA normal 2.  New onset diabetes 3.  Hyponatremia: Resolved 4.  Hypokalemia  Plan: 1.  Discussed with patient that his LFTs continue to trend down.  This is great news. 2.  I will set up follow-up for the patient in our clinic in the next few months.  Did discuss that when he establishes with PCP he will need to have recheck of LFTs within the next month to ensure they are trending downward 3.  Again discussed alcohol cessation  Thank you for your kind consultation, we will sign off.    LOS: 5 days   Levin Erp  10/06/2021, 10:19 AM  GI attending:  I agree with the physician assistant evaluation and plan.  I have also seen the patient.  Fortunately liver chemistries are improving.  Should they be resolved at outpatient follow-up I do not think any further  GI follow-up will be needed unless something changes.  Gatha Mayer, MD, [facility] See Shea Evans on call - gastroenterology for best contact person 10/06/2021 11:14 AM

## 2021-10-06 NOTE — Telephone Encounter (Signed)
Patient has been scheduled for a 4-week hospital follow up with Hyacinth Meeker, PA-C on Friday, 11/03/21 at 9 am. Appt information sent to [facility] to add to pt's discharge paperwork. Appt information mailed to patient as well.

## 2021-10-06 NOTE — Telephone Encounter (Signed)
-----   Message from Unk Lightning, [place] sent at 10/06/2021 10:25 AM EDT ----- Regarding: Follow up Needs follow up in next 4-6 weeks. Please schedule him with Dr. Leone Payor or myself for ELevated LFT's.  Thanks-JLL

## 2021-10-06 NOTE — Discharge Summary (Signed)
Physician Discharge Summary  Tom Merritt WUJ:[id number] DOB: [birth year] DOA: 10/01/2021  PCP: Pcp, No  Admit date: 10/01/2021 Discharge date: 10/06/2021  Admitted From: Home Disposition:  Home  Discharge Condition:Stable CODE STATUS:FULL Diet recommendation: Carb Modified   Brief/Interim Summary:  Patient is a 31 year old male without any significant past medical history who presents with nausea, vomiting, diarrhea from home.  Associated decreased oral intake.  No history of fever or chills, no history of drug abuse.  He was febrile on presentation, tachycardic, hypotensive.  Lab work showed sodium of 114, potassium of 3.2, creatinine of 1.5, elevated lactate elevated bilirubin, leukocytosis, elevated procalcitonin.  Found to be significantly dehydrated on presentation with sodium of less than 10 and urine.  Chest x-ray did not show any acute cardiopulmonary process.  CT abdomen/pelvis showed, no other but no other evidence of acute intra-abdominal or intrapelvic process.  COVID PCR negative.  C. difficile negative. patient was suspected to have sepsis of unknown etiology and started on broad antibiotics, IV fluids.   GI also consulted for persistent elevation in the bilirubin.  Overall condition significantly improved.  Plan for DC today to home.  Following problems were addressed during his hospitalization:  Suspected severe sepsis: Unknown etiology.  Presented with fever, tachycardia, tachypnea, leukocytosis.  Presented with persistent nausea, vomiting, diarrhea.  CT abdomen/pelvis does not show any acute intra-abdominal/pelvic pathology.  Elevated lactic acid. Elevated procalcitonin.  Since cultures have remained negative and he is afebrile, we discontinued antibiotics.  Continues to have leukocytosis so we will continue Augmentin for 5 more days.  Check CBC as an outpatient.   Diarrhea /vomiting: C. difficile negative.  Negative GI pathogen panel.    Started on Imodium.  Diarrhea has  stopped, IV fluids stopped   AKI/anion gap metabolic acidosis: Associated with diarrhea, vomiting.  Resolved   Hyponatremia/hypokalemia/hypomagnesemia hypocalcemia: Likely from hypovolemic hyponatremia associated with fluid loss along with electrolytes.  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 Acute hepatitis panel negative, as well as HIV.  Negative right upper quadrant ultrasound except for fatty liver.  Tylenol level negative.    Patient is  occasional drinker, denies binge drinking.  GI consulted.  Sent autoimmune panel, most of them are negative. Has iron-deficiency, supplemented with IV iron, oral iron started.  Liver function test significantly better   uncontrolled diabetes type 2 with hyperglycemia:   Hemoglobin A1c level of 10.  Diabetic coordinator consulted.  Recommended for oral medications on discharge   Hyperlipidemia: Elevated triglyceride.  LDL less than 10.  LDL not calculated.  Started on fenofibrate   Volume overload: Received tons of fluid due to hypotension during this hospital, now developed bilateral ankle edema.  Given a dose of Lasix    Uninsured: TOC consulted    Discharge Diagnoses:  Principal Problem:   Severe sepsis ([place]) Active Problems:   High anion gap metabolic acidosis   Lactic acidosis   AKI (acute kidney injury) ([place])   Transaminitis   Hyperglycemia   Hypokalemia   Acute hyponatremia   Abnormal LFTs    Discharge Instructions  Discharge Instructions     Diet Carb Modified   Complete by: As directed    Discharge instructions   Complete by: As directed    1)Please take prescribed medications as instructed 2)Monitor your blood glucose at home 3)Quit alcohol 4)Follow up with primary care physician on 10/20/2021.  Do a CBC, CMP test during the follow-up 5)You will be called  by gastroenterology office for the follow-up appointment.    Increase activity slowly   Complete by: As directed       Allergies as of 10/06/2021   No Known Allergies      Medication List     TAKE these medications    amoxicillin-clavulanate 875-125 MG tablet Commonly known as: AUGMENTIN Take 1 tablet by mouth every 12 (twelve) hours for 5 days.   blood glucose meter kit and supplies Kit Dispense based on patient and insurance preference. Use up to four times daily as directed.   fenofibrate 160 MG tablet Take 1 tablet (160 mg total) by mouth daily.   ferrous sulfate 325 (65 FE) MG tablet Take 1 tablet (325 mg total) by mouth daily with breakfast. Start taking on: October 07, 2021   glimepiride 4 MG tablet Commonly known as: Amaryl Take 1 tablet (4 mg total) by mouth every morning.   metFORMIN 500 MG tablet Commonly known as: Glucophage Take 1 tablet (500 mg total) by mouth 2 (two) times daily with a meal.   potassium chloride SA 20 MEQ tablet Commonly known as: KLOR-CON M Take 2 tablets (40 mEq total) by mouth daily for 7 days.        Follow-up Information     [facility] Follow up.   Specialty: Family Medicine Why: Your appointment for after hospital follow up to establish a primary care provider has been set up for Friday July 21,2023 at 09:30am with Juluis Mire. If for any reason you are unable to keep this appointment please call the number listed above. Contact information: [place] [phone]               No Known Allergies  Consultations: GI   Procedures/Studies: US Abdomen Limited RUQ (LIVER/GB)  Result Date: 10/02/2021 CLINICAL DATA:  Elevated liver enzymes. EXAM: ULTRASOUND ABDOMEN LIMITED RIGHT UPPER QUADRANT COMPARISON:  None Available. FINDINGS: Gallbladder: No gallstones or wall thickening visualized. No sonographic Murphy sign noted by sonographer. Common bile duct: Diameter: 3.2 mm Liver: No focal lesion identified.  Echogenic hepatic parenchyma suggesting hepatic steatosis. Portal vein is patent on color Doppler imaging with normal direction of blood flow towards the liver. Other: None. IMPRESSION: 1. No evidence of cholelithiasis or acute cholecystitis. 2. Echogenic hepatic parenchyma suggesting hepatic steatosis. Electronically Signed   By: Keane Police D.O.   On: 10/02/2021 08:36   CT ABDOMEN PELVIS W CONTRAST  Result Date: 10/01/2021 CLINICAL DATA:  Acute abdominal pain with nausea and vomiting for several days, initial encounter EXAM: CT ABDOMEN AND PELVIS WITH CONTRAST TECHNIQUE: Multidetector CT imaging of the abdomen and pelvis was performed using the standard protocol following bolus administration of intravenous contrast. RADIATION DOSE REDUCTION: This exam was performed according to the departmental dose-optimization program which includes automated exposure control, adjustment of the mA and/or kV according to patient size and/or use of iterative reconstruction technique. CONTRAST:  174m OMNIPAQUE IOHEXOL 300 MG/ML  SOLN COMPARISON:  None Available. FINDINGS: Lower chest: Mild dependent atelectatic changes are noted. No focal infiltrate is seen. Hepatobiliary: Fatty liver is noted.  Gallbladder is decompressed. Pancreas: Unremarkable. No pancreatic ductal dilatation or surrounding inflammatory changes. Spleen: Normal in size without focal abnormality. Adrenals/Urinary Tract: Adrenal glands are within normal limits. Kidneys demonstrate a normal enhancement pattern. No renal calculi or obstructive changes are noted. Ureters are within normal limits. The bladder is well distended. Stomach/Bowel: No obstructive or inflammatory changes of the colon are  seen. The appendix is within normal limits. Small bowel and stomach are unremarkable. Vascular/Lymphatic: No significant vascular findings are present. No enlarged abdominal or pelvic lymph nodes. Reproductive: Prostate is unremarkable. Other: No abdominal wall hernia  or abnormality. No abdominopelvic ascites. Musculoskeletal: No acute or significant osseous findings. IMPRESSION: Fatty liver. No acute abnormality is identified to correspond with the given clinical history. Electronically Signed   By: Inez Catalina M.D.   On: 10/01/2021 21:36   DG Chest Portable 1 View  Result Date: 10/01/2021 CLINICAL DATA:  Shortness of breath.  Fever and vomiting. EXAM: PORTABLE CHEST 1 VIEW COMPARISON:  None Available. FINDINGS: Low lung volumes and soft tissue attenuation from habitus limits assessment. Heart size is normal for technique. No focal airspace disease, pleural effusion, pneumothorax or pulmonary edema. No acute osseous abnormalities are seen. IMPRESSION: Low lung volumes without acute abnormality. Electronically Signed   By: Keith Rake M.D.   On: 10/01/2021 18:24      Subjective: Patient seen and examined the bedside this morning.  Hemodynamically stable for discharge today.  Discharge Exam: Vitals:   10/06/21 0329 10/06/21 0728  BP: 102/67 110/73  Pulse: 71 73  Resp: 19 20  Temp: 98.3 F (36.8 C) 98.3 F (36.8 C)  SpO2: 100% 100%   Vitals:   10/05/21 2358 10/06/21 0329 10/06/21 0636 10/06/21 0728  BP: 101/65 102/67  110/73  Pulse: 76 71  73  Resp: _0 Temp: 98 F (36.7 C) 98.3 F (36.8 C)  98.3 F (36.8 C)  TempSrc: Oral Oral  Oral  SpO2: 99% 100%  100%  Weight:   103.9 kg   Height:        General: Pt is alert, awake, not in acute distress, obese Cardiovascular: RRR, S1/S2 +, no rubs, no gallops Respiratory: CTA bilaterally, no wheezing, no rhonchi Abdominal: Soft, NT, ND, bowel sounds + Extremities: no edema, no cyanosis    The results of significant diagnostics from this hospitalization (including imaging, microbiology, ancillary and laboratory) are listed below for reference.     Microbiology: Recent Results (from the past 240 hour(s))  Culture, blood (Routine x 2)     Status: None (Preliminary result)   Collection  Time: 10/01/21  5:45 PM   Specimen: BLOOD  Result Value Ref Range Status   Specimen Description BLOOD SITE NOT SPECIFIED  Final   Special Requests   Final    BOTTLES DRAWN AEROBIC AND ANAEROBIC Blood Culture adequate volume   Culture   Final    NO GROWTH 4 DAYS Performed at [facility], [address], [place], [place] [address]    Report Status PENDING  Incomplete  SARS Coronavirus 2 by RT PCR (hospital order, performed in [facility] hospital lab) *cepheid single result test* Anterior Nasal Swab     Status: None   Collection Time: 10/02/21  1:05 AM   Specimen: Anterior Nasal Swab  Result Value Ref Range Status   SARS Coronavirus 2 by RT PCR NEGATIVE NEGATIVE Final    Comment: (NOTE) SARS-CoV-2 target nucleic acids are NOT DETECTED.  The SARS-CoV-2 RNA is generally detectable in upper and lower respiratory specimens during the acute phase of infection. The lowest concentration of SARS-CoV-2 viral copies this assay can detect is 250 copies / mL. A negative result does not preclude SARS-CoV-2 infection and should not be used as the sole basis for treatment or other patient management decisions.  A negative result may occur with improper specimen collection /  handling, submission of specimen other than nasopharyngeal swab, presence of viral mutation(s) within the areas targeted by this assay, and inadequate number of viral copies (<250 copies / mL). A negative result must be combined with clinical observations, patient history, and epidemiological information.  Fact Sheet for Patients:   https://www.patel.info/  Fact Sheet for Healthcare Providers: https://hall.com/  This test is not yet approved or  cleared by the [place] FDA and has been authorized for detection and/or diagnosis of SARS-CoV-2 by FDA under an Emergency Use Authorization (EUA).  This EUA will remain in effect (meaning this test can be used) for the duration  of the COVID-19 declaration under Section 564(b)(1) of the Act, 21 U.S.C. section 360bbb-3(b)(1), unless the authorization is terminated or revoked sooner.  Performed at [facility], [place] [address], [place], [place] [address]   Gastrointestinal Panel by PCR , Stool     Status: None   Collection Time: 10/02/21  5:30 AM   Specimen: Stool  Result Value Ref Range Status   Campylobacter species NOT DETECTED NOT DETECTED Final   Plesimonas shigelloides NOT DETECTED NOT DETECTED Final   Salmonella species NOT DETECTED NOT DETECTED Final   Yersinia enterocolitica NOT DETECTED NOT DETECTED Final   Vibrio species NOT DETECTED NOT DETECTED Final   Vibrio cholerae NOT DETECTED NOT DETECTED Final   Enteroaggregative E coli (EAEC) NOT DETECTED NOT DETECTED Final   Enteropathogenic E coli (EPEC) NOT DETECTED NOT DETECTED Final   Enterotoxigenic E coli (ETEC) NOT DETECTED NOT DETECTED Final   Shiga like toxin producing E coli (STEC) NOT DETECTED NOT DETECTED Final   Shigella/Enteroinvasive E coli (EIEC) NOT DETECTED NOT DETECTED Final   Cryptosporidium NOT DETECTED NOT DETECTED Final   Cyclospora cayetanensis NOT DETECTED NOT DETECTED Final   Entamoeba histolytica NOT DETECTED NOT DETECTED Final   Giardia lamblia NOT DETECTED NOT DETECTED Final   Adenovirus F40/41 NOT DETECTED NOT DETECTED Final   Astrovirus NOT DETECTED NOT DETECTED Final   Norovirus GI/GII NOT DETECTED NOT DETECTED Final   Rotavirus A NOT DETECTED NOT DETECTED Final   Sapovirus (I, II, IV, and V) NOT DETECTED NOT DETECTED Final    Comment: Performed at [facility], [place] [address] [place], [place] [address]  C Difficile Quick Screen w PCR reflex     Status: None   Collection Time: 10/02/21  5:30 AM   Specimen: Stool  Result Value Ref Range Status   C Diff antigen NEGATIVE NEGATIVE Final   C Diff toxin NEGATIVE NEGATIVE Final   C Diff interpretation No C. difficile detected.  Final    Comment: Performed at  [facility], [place] [address], [place], [place] [address]  MRSA Next Gen by PCR, Nasal     Status: None   Collection Time: 10/02/21  1:50 PM   Specimen: Nasal Mucosa; Nasal Swab  Result Value Ref Range Status   MRSA by PCR Next Gen NOT DETECTED NOT DETECTED Final    Comment: (NOTE) The GeneXpert MRSA Assay (FDA approved for NASAL specimens only), is one component of a comprehensive MRSA colonization surveillance program. It is not intended to diagnose MRSA infection nor to guide or monitor treatment for MRSA infections. Test performance is not FDA approved in patients less than 44 years old. Performed at [facility], [place] [address] [place], [place] [address]      Labs: BNP (last 3 results) No results for input(s): "BNP" in the last 8760 hours. Basic Metabolic  Panel: Recent Labs  Lab 10/01/21 2315 10/02/21 0407 10/02/21 0840 10/03/21 0152 10/04/21 0422 10/05/21 0044 10/06/21 0324  NA 117* 124* 126* 132* 133* 132* 137  K 3.1* 3.4* 3.5 2.9* 2.9* 3.4* 3.1*  CL 86* 92* 97* 101 96* 95* 100  CO2 16* 16* 15* 20* _0 GLUCOSE 268* 221* 171* 163* 164* 111* 113*  BUN 36* 31* 33* 29* _1 CREATININE 1.54* 1.43* 1.62* 1.14 0.74 0.70 0.72  CALCIUM 5.8* 6.3* 6.5* 6.6* 6.8* 7.3* 7.3*  MG 1.0* 1.1*  --  1.6*  --   --   --    Liver Function Tests: Recent Labs  Lab 10/02/21 0407 10/03/21 0152 10/04/21 0422 10/05/21 0044 10/06/21 0324  AST 49* 52* 90* 73* 41  ALT 54* 50* 53* 62* 46*  ALKPHOS 67 81 98 136* 144*  BILITOT 10.9* 10.7* 9.1* 8.4* 4.4*  PROT 5.4* 5.1* 5.1* 5.8* 5.3*  ALBUMIN 2.2* 2.0* 1.8* 2.1* 1.8*   Recent Labs  Lab 10/01/21 1745  LIPASE 63*   No results for input(s): "AMMONIA" in the last 168 hours. CBC: Recent Labs  Lab 10/01/21 1745 10/02/21 0407 10/03/21 0152 10/04/21 0422 10/05/21 0044 10/06/21 0324  WBC 15.3* 11.1* 14.6* 10.5 16.1* 17.5*  NEUTROABS 14.6* 10.6*  --   --   --   --   HGB 13.9 12.7* 11.9* 12.1* 13.0 10.8*  HCT  37.8* 35.0* 32.2* 33.1* 36.0* 30.3*  MCV 75.3* 75.8* 75.9* 75.7* 77.6* 78.9*  PLT PLATELET CLUMPS NOTED ON SMEAR, UNABLE TO ESTIMATE 41* 44* 57* 92* 120*   Cardiac Enzymes: Recent Labs  Lab 10/01/21 2315  CKTOTAL 1,196*   BNP: Invalid input(s): "POCBNP" CBG: Recent Labs  Lab 10/05/21 1619 10/05/21 1950 10/05/21 2356 10/06/21 0328 10/06/21 0758  GLUCAP 124* 108* 139* 112* 110*   D-Dimer No results for input(s): "DDIMER" in the last 72 hours. Hgb A1c No results for input(s): "HGBA1C" in the last 72 hours. Lipid Profile Recent Labs    10/04/21 0422  CHOL 119  HDL <10*  LDLCALC NOT CALCULATED  TRIG 348*  CHOLHDL NOT CALCULATED   Thyroid function studies No results for input(s): "TSH", "T4TOTAL", "T3FREE", "THYROIDAB" in the last 72 hours.  Invalid input(s): "FREET3" Anemia work up Recent Labs    10/03/21 1149 10/03/21 1205  VITAMINB12  --  4,073*  FOLATE 14.9  --   FERRITIN 1,225*  --   TIBC  --  168*  IRON  --  24*   Urinalysis    Component Value Date/Time   COLORURINE AMBER (A) 10/01/2021 2029   APPEARANCEUR CLOUDY (A) 10/01/2021 2029   LABSPEC 1.012 10/01/2021 2029   PHURINE 5.0 10/01/2021 2029   GLUCOSEU 150 (A) 10/01/2021 2029   HGBUR MODERATE (A) 10/01/2021 2029   BILIRUBINUR NEGATIVE 10/01/2021 2029   [place] NEGATIVE 10/01/2021 2029   PROTEINUR NEGATIVE 10/01/2021 2029   NITRITE NEGATIVE 10/01/2021 2029   LEUKOCYTESUR TRACE (A) 10/01/2021 2029   Sepsis Labs Recent Labs  Lab 10/03/21 0152 10/04/21 0422 10/05/21 0044 10/06/21 0324  WBC 14.6* 10.5 16.1* 17.5*   Microbiology Recent Results (from the past 240 hour(s))  Culture, blood (Routine x 2)     Status: None (Preliminary result)   Collection Time: 10/01/21  5:45 PM   Specimen: BLOOD  Result Value Ref Range Status   Specimen Description BLOOD SITE NOT SPECIFIED  Final   Special Requests   Final    BOTTLES DRAWN AEROBIC AND ANAEROBIC Blood Culture  adequate volume   Culture    Final    NO GROWTH 4 DAYS Performed at [facility], [place] [address] [place], [place] [address]    Report Status PENDING  Incomplete  SARS Coronavirus 2 by RT PCR (hospital order, performed in [facility] hospital lab) *cepheid single result test* Anterior Nasal Swab     Status: None   Collection Time: 10/02/21  1:05 AM   Specimen: Anterior Nasal Swab  Result Value Ref Range Status   SARS Coronavirus 2 by RT PCR NEGATIVE NEGATIVE Final    Comment: (NOTE) SARS-CoV-2 target nucleic acids are NOT DETECTED.  The SARS-CoV-2 RNA is generally detectable in upper and lower respiratory specimens during the acute phase of infection. The lowest concentration of SARS-CoV-2 viral copies this assay can detect is 250 copies / mL. A negative result does not preclude SARS-CoV-2 infection and should not be used as the sole basis for treatment or other patient management decisions.  A negative result may occur with improper specimen collection / handling, submission of specimen other than nasopharyngeal swab, presence of viral mutation(s) within the areas targeted by this assay, and inadequate number of viral copies (<250 copies / mL). A negative result must be combined with clinical observations, patient history, and epidemiological information.  Fact Sheet for Patients:   https://www.patel.info/  Fact Sheet for Healthcare Providers: https://hall.com/  This test is not yet approved or  cleared by the [place] FDA and has been authorized for detection and/or diagnosis of SARS-CoV-2 by FDA under an Emergency Use Authorization (EUA).  This EUA will remain in effect (meaning this test can be used) for the duration of the COVID-19 declaration under Section 564(b)(1) of the Act, 21 U.S.C. section 360bbb-3(b)(1), unless the authorization is terminated or revoked sooner.  Performed at [facility], [place] [address], [place], [place] [address]    Gastrointestinal Panel by PCR , Stool     Status: None   Collection Time: 10/02/21  5:30 AM   Specimen: Stool  Result Value Ref Range Status   Campylobacter species NOT DETECTED NOT DETECTED Final   Plesimonas shigelloides NOT DETECTED NOT DETECTED Final   Salmonella species NOT DETECTED NOT DETECTED Final   Yersinia enterocolitica NOT DETECTED NOT DETECTED Final   Vibrio species NOT DETECTED NOT DETECTED Final   Vibrio cholerae NOT DETECTED NOT DETECTED Final   Enteroaggregative E coli (EAEC) NOT DETECTED NOT DETECTED Final   Enteropathogenic E coli (EPEC) NOT DETECTED NOT DETECTED Final   Enterotoxigenic E coli (ETEC) NOT DETECTED NOT DETECTED Final   Shiga like toxin producing E coli (STEC) NOT DETECTED NOT DETECTED Final   Shigella/Enteroinvasive E coli (EIEC) NOT DETECTED NOT DETECTED Final   Cryptosporidium NOT DETECTED NOT DETECTED Final   Cyclospora cayetanensis NOT DETECTED NOT DETECTED Final   Entamoeba histolytica NOT DETECTED NOT DETECTED Final   Giardia lamblia NOT DETECTED NOT DETECTED Final   Adenovirus F40/41 NOT DETECTED NOT DETECTED Final   Astrovirus NOT DETECTED NOT DETECTED Final   Norovirus GI/GII NOT DETECTED NOT DETECTED Final   Rotavirus A NOT DETECTED NOT DETECTED Final   Sapovirus (I, II, IV, and V) NOT DETECTED NOT DETECTED Final    Comment: Performed at [facility], [place] [place],  [address]  C Difficile Quick Screen w PCR reflex     Status: None   Collection Time: 10/02/21  5:30 AM   Specimen: Stool  Result Value Ref Range Status   C  Diff antigen NEGATIVE NEGATIVE Final   C Diff toxin NEGATIVE NEGATIVE Final   C Diff interpretation No C. difficile detected.  Final    Comment: Performed at [facility], [place] [address], [place], [place] [address]  MRSA Next Gen by PCR, Nasal     Status: None   Collection Time: 10/02/21  1:50 PM   Specimen: Nasal Mucosa; Nasal Swab  Result Value Ref Range Status   MRSA by PCR Next Gen  NOT DETECTED NOT DETECTED Final    Comment: (NOTE) The GeneXpert MRSA Assay (FDA approved for NASAL specimens only), is one component of a comprehensive MRSA colonization surveillance program. It is not intended to diagnose MRSA infection nor to guide or monitor treatment for MRSA infections. Test performance is not FDA approved in patients less than 36 years old. Performed at [facility], [place] [address] [place], [place] [address]     Please note: You were cared for by a hospitalist during your hospital stay. Once you are discharged, your primary care physician will handle any further medical issues. Please note that NO REFILLS for any discharge medications will be authorized once you are discharged, as it is imperative that you return to your primary care physician (or establish a relationship with a primary care physician if you do not have one) for your post hospital discharge needs so that they can reassess your need for medications and monitor your lab values.    Time coordinating discharge: 40 minutes  SIGNED:   Shelly Coss, MD  [facility] 10/06/2021, 10:40 AM Pager [phone]  If 7PM-7AM, please contact night-coverage [email or web address] Password [email or web address]

## 2021-10-20 ENCOUNTER — Ambulatory Visit (INDEPENDENT_AMBULATORY_CARE_PROVIDER_SITE_OTHER): Payer: Self-pay | Admitting: Primary Care

## 2021-10-20 ENCOUNTER — Other Ambulatory Visit: Payer: Self-pay

## 2021-10-20 ENCOUNTER — Encounter (INDEPENDENT_AMBULATORY_CARE_PROVIDER_SITE_OTHER): Payer: Self-pay | Admitting: Primary Care

## 2021-10-20 VITALS — BP 130/85 | HR 89 | Temp 98.1°F | Ht 69.0 in | Wt 217.2 lb

## 2021-10-20 DIAGNOSIS — I10 Essential (primary) hypertension: Secondary | ICD-10-CM

## 2021-10-20 DIAGNOSIS — Z23 Encounter for immunization: Secondary | ICD-10-CM

## 2021-10-20 DIAGNOSIS — E119 Type 2 diabetes mellitus without complications: Secondary | ICD-10-CM

## 2021-10-20 DIAGNOSIS — Z09 Encounter for follow-up examination after completed treatment for conditions other than malignant neoplasm: Secondary | ICD-10-CM

## 2021-10-20 MED ORDER — METFORMIN HCL 1000 MG PO TABS
1000.0000 mg | ORAL_TABLET | Freq: Two times a day (BID) | ORAL | 3 refills | Status: DC
  Filled 2021-10-20: qty 180, 90d supply, fill #0

## 2021-10-20 MED ORDER — LISINOPRIL 5 MG PO TABS
5.0000 mg | ORAL_TABLET | Freq: Every day | ORAL | 3 refills | Status: AC
  Filled 2021-10-20: qty 90, 90d supply, fill #0
  Filled 2022-02-08: qty 90, 90d supply, fill #1

## 2021-10-20 MED ORDER — EMPAGLIFLOZIN 25 MG PO TABS
25.0000 mg | ORAL_TABLET | Freq: Every day | ORAL | 1 refills | Status: AC
Start: 2021-10-20 — End: ?
  Filled 2021-10-20: qty 90, 90d supply, fill #0

## 2021-10-20 MED ORDER — GLIMEPIRIDE 4 MG PO TABS
4.0000 mg | ORAL_TABLET | Freq: Every day | ORAL | 1 refills | Status: DC
  Filled 2021-10-20: qty 90, 90d supply, fill #0

## 2021-10-20 NOTE — Progress Notes (Unsigned)
[facility]   Subjective:   [name] is a 31 y.o. male presents for hospital follow up and establish care. Presented to the ED  10/01/21 -reported 3 to 4 days of intermittent nausea resulting in 3-5 episodes per day of nonbilious emesis over that timeframe, with last such episode occurring just prior to presenting to [facility] emergency department.  1 episode of vomiting earlier the day of admission, he noted some blood tinge vomitus..Admit date to the hospital was 10/01/21, patient was discharged from the hospital on 10/06/21, patient was admitted for: Severe sepsis. Today patient has No headache, No chest pain, No abdominal pain - No Nausea, No new weakness tingling or numbness, No Cough - shortness of breath. Glad to be out of the hospital.  Past Medical History:  Diagnosis Date   Congenital abnormality of ear-right      No Known Allergies    Current Outpatient Medications on File Prior to Visit  Medication Sig Dispense Refill   blood glucose meter kit and supplies KIT Dispense based on patient and insurance preference. Use up to four times daily as directed. 1 each 0   fenofibrate 160 MG tablet Take 1 tablet (160 mg total) by mouth daily. 30 tablet 1   ferrous sulfate 325 (65 FE) MG tablet Take 1 tablet (325 mg total) by mouth daily with breakfast. 30 tablet 1   glimepiride (AMARYL) 2 MG tablet Take 2 tablets (4 mg total) by mouth every morning. 60 tablet 1   metFORMIN (GLUCOPHAGE) 500 MG tablet Take 1 tablet (500 mg total) by mouth 2 (two) times daily with a meal. 60 tablet 1   potassium chloride SA (KLOR-CON M) 20 MEQ tablet Take 2 tablets (40 mEq total) by mouth daily for 7 days. 14 tablet 0   No current facility-administered medications on file prior to visit.     Review of System: Comprehensive ROS Pertinent positive and negative noted in HPI   Objective:  BP 130/85   Pulse 89   Temp 98.1 F (36.7 C) (Oral)   Ht 5\' 9"  (1.753 m)   Wt 217 lb 3.2 oz  (98.5 kg)   SpO2 99%   BMI 32.07 kg/m   Filed Weights   10/20/21 0956  Weight: 217 lb 3.2 oz (98.5 kg)    Physical Exam: General Appearance: Well nourished, in no apparent distress. 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 No erythema, swelling, or exudate on post pharynx.  Tonsils not swollen or erythematous. Hearing normal.  Neck: Supple, thyroid normal.  Respiratory: Respiratory effort normal, BS equal bilaterally without rales, rhonchi, wheezing or stridor.  Cardio: RRR with no MRGs. Brisk peripheral pulses without edema.  Abdomen: Soft, + BS.  Non tender, no guarding, rebound, hernias, masses. Lymphatics: Non tender without lymphadenopathy.  Musculoskeletal: Full ROM, 5/5 strength, normal gait.  Skin: Warm, dry without rashes, lesions, ecchymosis.  Neuro: Cranial nerves intact. Normal muscle tone, no cerebellar symptoms. Sensation intact.  Psych: Awake and oriented X 3, normal affect, Insight and Judgment appropriate.    Assessment:  Tom Merritt was seen today for hospitalization follow-up.  Diagnoses and all orders for this visit:  Need for Tdap vaccination -     Tdap vaccine greater than or equal to 7yo IM  Hospital discharge follow-up -     CBC with Differential -  CMP14+EGFR  Essential hypertension Renal protection and BP goal - < 130/80 Explained that having normal blood pressure is the goal and medications are helping to get to goal and maintain normal blood pressure. DIET: Limit salt intake, read nutrition labels to check salt content, limit fried and high fatty foods  Avoid using multisymptom OTC cold preparations that generally contain sudafed which can rise BP. Consult with pharmacist on best cold relief products to use for persons with HTN EXERCISE Discussed  incorporating exercise such as walking - 30 minutes most days of the week and can do in 10 minute intervals    -     lisinopril (ZESTRIL) 5 MG tablet; Take 1 tablet (5 mg total) by mouth daily.  Type 2 diabetes mellitus without complication, without long-term current use of insulin (HCC) Discussed  co- morbidities with uncontrol diabetes  Complications -diabetic retinopathy, (close your eyes ? What do you see nothing) nephropathy decrease in kidney function- can lead to dialysis-on a machine 3 days a week to filter your kidney, neuropathy- numbness and tinging in your hands and feet,  increase risk of heart attack and stroke, and amputation due to decrease wound healing and circulation. Decrease your risk by taking medication daily as prescribed, monitor carbohydrates- foods that are high in carbohydrates are the following rice, potatoes, breads, sugars, and pastas.  Reduction in the intake (eating) will assist in lowering your blood sugars. Exercise daily at least 30 minutes daily.  Medication changes metformin $RemoveBeforeDEI'1000mg'[id number]$  bid Amary $RemoveB'4mg'mrZIvEvd$  QD, Jardiance $RemoveBefor'25mg'IFADRFdwIRVR$  QD Other orders -     metFORMIN (GLUCOPHAGE) 1000 MG tablet; Take 1 tablet (1,000 mg total) by mouth 2 (two) times daily with a meal. -     empagliflozin (JARDIANCE) 25 MG TABS tablet; Take 1 tablet (25 mg total) by mouth daily before breakfast. -     glimepiride (AMARYL) 4 MG tablet; Take 1 tablet (4 mg total) by mouth daily before breakfast.    This note has been created with Surveyor, quantity. Any transcriptional errors are unintentional.   Kerin Perna, NP 10/20/2021, 10:14 AM

## 2021-10-20 NOTE — Patient Instructions (Addendum)
Luke Monday 24 at [employer] Tdap (ttanos, difteria y [place]): lo que debe saber Tdap (Tetanus, Diphtheria, Pertussis) Vaccine: What You Need to Know 1. Por qu vacunarse? La vacuna Tdap puede prevenir el ttanos, la difteria y la tos [place]. La difteria y la tos Benetta Spar se [place] de persona a Social worker. El ttanos ingresa al organismo a travs de cortes o heridas. El [place] (T) provoca rigidez dolorosa en los msculos. El ttanos puede causar graves problemas de [place], como no poder abrir la boca, Warehouse manager dificultad para tragar y Industrial/product designer, o la muerte. La DIFTERIA (D) puede causar dificultad para respirar, insuficiencia cardaca, parlisis o muerte. La TOS FERINA (aP), tambin conocida como "tos convulsa", puede causar tos violenta e incontrolable lo que hace difcil respirar, comer o beber. La tos ferina puede ser muy grave, especialmente en los bebs y en los nios pequeos, y causar neumona, convulsiones, dao cerebral o la [place]. En adolescentes y [address], puede causar prdida de [place], prdida del control de la vejiga, [place] y fracturas de Forensic psychologist al toser de Wellsite geologist intensa. 2. Madilyn Fireman Tdap La vacuna Tdap es solo para nios de 7 aos en adelante, adolescentes y [place].  Los adolescentes deben recibir una dosis nica de la vacuna Tdap, preferentemente a los 11 o 12 aos. Las personas embarazadas deben recibir una dosis de la vacuna Tdap durante cada embarazo, preferiblemente durante la primera parte del tercer trimestre, para ayudar a proteger al recin nacido de la tos [place]. Los bebs tienen mayor riesgo de sufrir complicaciones graves y potencialmente mortales debido a la tos [place]. Los adultos que nunca recibieron la vacuna Tdap deben recibir una dosis. Adems, los adultos deben recibir una dosis de refuerzo de Tdap o Td (una vacuna diferente que protege contra el ttanos y la difteria, pero no contra la tos [place]) cada 10 aos, o despus de 5 aos en caso de herida o [place]  grave o sucia. La vacuna Tdap puede ser administrada al mismo tiempo que otras vacunas. 3. Hable con el mdico Comunquese con la persona que le coloca las vacunas si la persona que la recibe: Ha tenido una reaccin alrgica despus de Neomia Dear dosis anterior de cualquier vacuna contra el ttanos, la difteria o la tos [place], o cualquier Programmer, multimedia grave, potencialmente mortal Ha tenido un coma, disminucin del nivel de la conciencia o convulsiones prolongadas dentro de los [address] posteriores a una dosis anterior de cualquier vacuna contra la tos Psychologist, educational (DTP, DTaP o Tdap) Tiene convulsiones u otro problema del sistema nervioso Alguna vez tuvo sndrome de Pension scheme manager (tambin llamado "SGB") Ha tenido dolor intenso o hinchazn despus de una dosis anterior de cualquier vacuna contra el ttanos o la difteria En algunos casos, es posible que el mdico decida posponer la aplicacin de la vacuna Tdap para una visita en el futuro. Las personas que sufren trastornos menores, como un resfro, pueden vacunarse. Las personas que tienen enfermedades moderadas o graves generalmente deben esperar hasta recuperarse para poder recibir la vacuna Tdap.  Su mdico puede darle ms informacin. 4. Riesgos de [place] reaccin a la vacuna Despus de recibir la vacuna Tdap a veces se puede Surveyor, mining, enrojecimiento o Paramedic donde se aplic la inyeccin, fiebre leve, dolor de cabeza, sensacin de cansancio y nuseas, vmitos, diarrea o dolor de [place]. Las personas a veces se desmayan despus de procedimientos mdicos, incluida la vacunacin. Informe al mdico si se siente mareado, tiene cambios en la visin o zumbidos en los odos.  Al igual que con cualquier Automatic Data, existe una probabilidad muy remota de que una vacuna cause una reaccin alrgica grave, otra lesin grave o la muerte. 5. Qu pasa si se presenta un problema grave? Podra producirse una reaccin alrgica despus de que la persona vacunada  abandone la clnica. Si observa signos de Runner, broadcasting/film/video grave (ronchas, hinchazn de la cara y la garganta, dificultad para respirar, latidos cardacos acelerados, mareos o debilidad), llame al 9-1-1 y lleve a la persona al hospital ms cercano. Si se presentan otros signos que le preocupan, comunquese con su mdico.  Las reacciones adversas deben informarse al Sistema de Informe de Eventos Adversos de Administrator, arts (VAERS). Por lo general, el mdico presenta este informe o puede hacerlo usted mismo. Visite el sitio web del VAERS en www.vaers.LAgents.no o llame al 3100702557. El VAERS es solo para Biomedical engineer, y los miembros de su personal no proporcionan asesoramiento mdico. 6. Programa Nacional de Compensacin de Daos por [employer] El SunTrust de Compensacin de Daos por Administrator, arts (National Vaccine Injury Kohl's, Cabin crew) es un programa federal que fue creado para Patent examiner a las personas que puedan haber sufrido daos al recibir ciertas vacunas. Las Investment banker, operational a presuntas lesiones o muerte debidas a la vacunacin tienen un lmite de tiempo para su presentacin, que puede ser de tan solo Lex[employer] International. Visite el sitio web del VICP en SpiritualWord.at o llame al 1-925-480-7576 para obtener ms informacin acerca del programa y de cmo presentar un reclamo. 7. Cmo puedo obtener ms informacin? Pregntele a su mdico. Comunquese con el servicio de salud de su localidad o su estado. Visite el sitio web de Tax inspector) (Administracin de Alimentos y Media planner) para ver los prospectos de las vacunas e informacin adicional en FinderList.no. Comunquese con Building control surveyor for SPX Corporation) (Centros para el Control y la Prevencin de [place]): Llame al 684-571-1975 (1-800-CDC-INFO) o Visite el sitio web de ALLTEL Corporation en PicCapture.uy. Fuente: Dietitian de  informacin de los CDC sobre la vacuna Tdap (ttanos, difteria y [place]) (11/06/2019) Este mismo material est disponible en FootballExhibition.com.br sin cargo. Esta informacin no tiene Theme park manager el consejo del mdico. Asegrese de hacerle al mdico cualquier pregunta que tenga. Document Revised: 03/07/2021 Document Reviewed: 12/19/2020 Elsevier Patient Education  2023 Ar[employer].

## 2021-10-21 LAB — CBC WITH DIFFERENTIAL/PLATELET
Basophils Absolute: 0.1 10*3/uL (ref 0.0–0.2)
Basos: 2 %
EOS (ABSOLUTE): 0.1 10*3/uL (ref 0.0–0.4)
Eos: 2 %
Hematocrit: 39.4 % (ref 37.5–51.0)
Hemoglobin: 12.6 g/dL — ABNORMAL LOW (ref 13.0–17.7)
Immature Grans (Abs): 0 10*3/uL (ref 0.0–0.1)
Immature Granulocytes: 0 %
Lymphocytes Absolute: 2.2 10*3/uL (ref 0.7–3.1)
Lymphs: 37 %
MCH: 27.6 pg (ref 26.6–33.0)
MCHC: 32 g/dL (ref 31.5–35.7)
MCV: 86 fL (ref 79–97)
Monocytes Absolute: 0.4 10*3/uL (ref 0.1–0.9)
Monocytes: 7 %
Neutrophils Absolute: 3 10*3/uL (ref 1.4–7.0)
Neutrophils: 52 %
Platelets: 388 10*3/uL (ref 150–450)
RBC: 4.56 x10E6/uL (ref 4.14–5.80)
RDW: 14.7 % (ref 11.6–15.4)
WBC: 5.8 10*3/uL (ref 3.4–10.8)

## 2021-10-21 LAB — CMP14+EGFR
ALT: 41 IU/L (ref 0–44)
AST: 30 IU/L (ref 0–40)
Albumin/Globulin Ratio: 1.3 (ref 1.2–2.2)
Albumin: 4.1 g/dL (ref 4.1–5.1)
Alkaline Phosphatase: 73 IU/L (ref 44–121)
BUN/Creatinine Ratio: 13 (ref 9–20)
BUN: 10 mg/dL (ref 6–20)
Bilirubin Total: 1.1 mg/dL (ref 0.0–1.2)
CO2: 24 mmol/L (ref 20–29)
Calcium: 9.6 mg/dL (ref 8.7–10.2)
Chloride: 102 mmol/L (ref 96–106)
Creatinine, Ser: 0.75 mg/dL — ABNORMAL LOW (ref 0.76–1.27)
Globulin, Total: 3.1 g/dL (ref 1.5–4.5)
Glucose: 145 mg/dL — ABNORMAL HIGH (ref 70–99)
Potassium: 4.9 mmol/L (ref 3.5–5.2)
Sodium: 140 mmol/L (ref 134–144)
Total Protein: 7.2 g/dL (ref 6.0–8.5)
eGFR: 123 mL/min/{1.73_m2} (ref >59)

## 2021-10-23 ENCOUNTER — Other Ambulatory Visit: Payer: Self-pay

## 2021-10-23 ENCOUNTER — Encounter: Payer: Self-pay | Admitting: Pharmacist

## 2021-10-23 ENCOUNTER — Ambulatory Visit: Payer: Self-pay | Attending: Internal Medicine | Admitting: Pharmacist

## 2021-10-23 DIAGNOSIS — E119 Type 2 diabetes mellitus without complications: Secondary | ICD-10-CM

## 2021-10-23 NOTE — Progress Notes (Signed)
S:     No chief complaint on file.  Tom Merritt is a 31 y.o. male who presents for diabetes evaluation, education, and management. PMH is significant for recent hospitalization for NV, diarrhea found to be septic in the setting of AKI/severe dehydration, gap acidosis (7/2-10/06/2021). He had no prior PMH before this hospitalization. He was also found to have new onset T2DM (A1c 10.2%).  Patient was referred and last seen by Primary Care Provider, Gwinda Passe, on 10/20/2021.   Today, patient arrives in good spirits and presents without any assistance.  Patient reports Diabetes is newly diagnosed. He was unaware he had DM until he was hospitalized earlier this month. Denies any personal hx of ACS/CAD, CHF, stroke, or CKD. No personal or family hx of thyroid cancer. No hx of pancreatitis.   Family/Social History:  -Fhx: no pertinent positives  -Tobacco: never smoker  -Alcohol: none reported   Current diabetes medications include: glimepiride 4 mg daily, Jardiance 25 mg daily, metformin 1000 mg BID Current hypertension medications include: lisinopril 5 mg daily  Current hyperlipidemia medications include: fenofibrate 160 mg daily   Patient reports adherence to taking all medications as prescribed.   Insurance coverage: none  Patient denies hypoglycemic events.  Reported home fasting blood sugars: not checking  Reported 2 hour post-meal/random blood sugars: not checking  Patient denies nocturia (nighttime urination).  Patient denies neuropathy (nerve pain). Patient denies visual changes. Patient reports self foot exams.   Patient reported dietary habits:  -Has been trying to implement dietary changes since hospitalization.  -Eats "a lot of tortillas"  -Does limit sugar-sweetened beverages. Denies eating sweets or dessert foods.    Patient-reported exercise habits:  -No formal home exercise regimen    O:  No CGM, home glucose meter.   Lab Results  Component Value  Date   HGBA1C 10.6 (H) 10/01/2021   There were no vitals filed for this visit.  Lipid Panel     Component Value Date/Time   CHOL 119 10/04/2021 0422   TRIG 348 (H) 10/04/2021 0422   HDL <10 (L) 10/04/2021 0422   CHOLHDL NOT CALCULATED 10/04/2021 0422   VLDL 70 (H) 10/04/2021 0422   LDLCALC NOT CALCULATED 10/04/2021 0422    Clinical Atherosclerotic Cardiovascular Disease (ASCVD): No  The ASCVD Risk score (Arnett DK, et al., 2019) failed to calculate for the following reasons:   The 2019 ASCVD risk score is only valid for ages 77 to 49    A/P: Diabetes newly dx currently uncontrolled based on A1c. Degree of control unknown with no home readings. Patient is able to verbalize appropriate hypoglycemia management plan. Medication adherence appears appropriate. His liver function has normalized, along with renal function and electrolytes. Metformin and Jardiance okay to continue for now. Will continue glimepiride. I have gone over how and when to check CBGs at home. Will hold off on med changes until follow-up and assessment of home CBGs. Pt is agreeable to this.  -Continued current regimen. -Emphasized need for CBG monitoring. Pt will start checking once daily and vary the time of day he checks.   -Patient educated on purpose, proper use, and potential adverse effects of metformin, Jardiance, and glimepiride.  -Extensively discussed pathophysiology of diabetes, recommended lifestyle interventions, dietary effects on blood sugar control.  -Counseled on s/sx of and management of hypoglycemia.  -Next A1c anticipated 12/2021.   Written patient instructions provided. Patient verbalized understanding of treatment plan.  Total time in face to face counseling 30 minutes.  Follow-up:  Pharmacist in 1 month.  Butch Penny, PharmD, [name], CPP Clinical Pharmacist [facility] [phone]

## 2021-10-27 ENCOUNTER — Telehealth (INDEPENDENT_AMBULATORY_CARE_PROVIDER_SITE_OTHER): Payer: Self-pay

## 2021-10-27 NOTE — Telephone Encounter (Signed)
Patient verified DOB. He is aware of normal labs and diet advice. Maryjean Morn, CMA

## 2021-10-27 NOTE — Telephone Encounter (Signed)
-----   Message from Grayce Sessions, NP sent at 10/26/2021  2:16 PM EDT ----- Labs are  normal. Try to drink at least 48 oz of water per day. Work on eating a low fat, heart healthy diet and participate in regular aerobic exercise program to control as well. Exercise at least  30 minutes per day-5 days per week. Monitor eating red meat, fried foods,  junk foods, sodas, sugary foods or drinks, unhealthy snacking, alcohol or smoking.

## 2021-10-31 ENCOUNTER — Other Ambulatory Visit: Payer: Self-pay

## 2021-11-02 ENCOUNTER — Institutional Professional Consult (permissible substitution) (INDEPENDENT_AMBULATORY_CARE_PROVIDER_SITE_OTHER): Payer: Self-pay | Admitting: Licensed Clinical Social Worker

## 2021-11-03 ENCOUNTER — Encounter: Payer: Self-pay | Admitting: Physician Assistant

## 2021-11-03 ENCOUNTER — Ambulatory Visit (INDEPENDENT_AMBULATORY_CARE_PROVIDER_SITE_OTHER): Payer: Self-pay | Admitting: Physician Assistant

## 2021-11-03 ENCOUNTER — Other Ambulatory Visit: Payer: Self-pay

## 2021-11-03 VITALS — BP 148/86 | HR 87 | Ht 69.0 in | Wt 219.0 lb

## 2021-11-03 DIAGNOSIS — R7989 Other specified abnormal findings of blood chemistry: Secondary | ICD-10-CM

## 2021-11-03 NOTE — Progress Notes (Signed)
Chief Complaint: Follow-up hospitalization for elevated LFTs  HPI:    Tom Merritt is a 31 year old Hispanic male, signed to Dr. Carlean Purl at last hospitalization, with past medical history as listed below, who returns to clinic today for follow-up after recent hospitalization and consult by [place] for elevated LFTs.    10/03/2021-10/06/2021 patient followed by her service for elevated LFTs.  He did have nausea, vomiting, diarrhea and fever and acute hepatitis was ruled out.  Cause was really unclear.  It was thought possibly alcohol use and diet.  He did not have any biliary obstruction.  Also had new onset diabetes in the hospital.  Eventually labs all returned unremarkable and it was thought patient had elevated LFTs possibly due to sepsis in the setting of acute but resolved nausea vomiting and diarrhea.    10/20/2021 CMP with an elevated glucose at 145 and otherwise normal.  CBC with improved hemoglobin at 12.6 and otherwise normal.    Today, the patient tells me he is doing very well, no symptoms.  He has stopped drinking alcohol and is trying to eat a healthier diet and lose some weight.  He is working with his primary care provider to get his diabetes under control.    Denies any fever, chills, weight loss, nausea, vomiting, heartburn, reflux, abdominal pain or change in bowel habits.  Past Medical History:  Diagnosis Date   Congenital abnormality of ear-right     No past surgical history on file.  Current Outpatient Medications  Medication Sig Dispense Refill   blood glucose meter kit and supplies KIT Dispense based on patient and insurance preference. Use up to four times daily as directed. 1 each 0   empagliflozin (JARDIANCE) 25 MG TABS tablet Take 1 tablet (25 mg total) by mouth daily before breakfast. 90 tablet 1   fenofibrate 160 MG tablet Take 1 tablet (160 mg total) by mouth daily. 30 tablet 1   ferrous sulfate 325 (65 FE) MG tablet Take 1 tablet (325 mg total) by mouth daily with  breakfast. 30 tablet 1   glimepiride (AMARYL) 4 MG tablet Take 1 tablet (4 mg total) by mouth daily before breakfast. 90 tablet 1   lisinopril (ZESTRIL) 5 MG tablet Take 1 tablet (5 mg total) by mouth daily. 90 tablet 3   metFORMIN (GLUCOPHAGE) 1000 MG tablet Take 1 tablet (1,000 mg total) by mouth 2 (two) times daily with a meal. 180 tablet 3   potassium chloride SA (KLOR-CON M) 20 MEQ tablet Take 2 tablets (40 mEq total) by mouth daily for 7 days. 14 tablet 0   No current facility-administered medications for this visit.    Allergies as of 11/03/2021   (No Known Allergies)    No family history on file.  Social History   Socioeconomic History   Marital status: Single    Spouse name: Not on file   Number of children: Not on file   Years of education: Not on file   Highest education level: Not on file  Occupational History   Not on file  Tobacco Use   Smoking status: Never   Smokeless tobacco: Never  Substance and Sexual Activity   Alcohol use: Yes    Comment: 3-4 beers (12 oz) every other day   Drug use: Never   Sexual activity: Not on file  Other Topics Concern   Not on file  Social History Narrative   Not on file   Social Determinants of Health   Financial Resource Strain: Not  on file  Food Insecurity: Not on file  Transportation Needs: Not on file  Physical Activity: Not on file  Stress: Not on file  Social Connections: Not on file  Intimate Partner Violence: Not on file    Review of Systems:    Constitutional: No weight loss, fever or chills Cardiovascular: No chest pain Respiratory: No SOB Gastrointestinal: See HPI and otherwise negative   Physical Exam:  Vital signs: BP (!) 148/86   Pulse 87   Ht $R'5\' 9"'Ta$  (1.753 m)   Wt 219 lb (99.3 kg)   BMI 32.34 kg/m    Constitutional:   Pleasant Hispanic male appears to be in NAD, Well developed, Well nourished, alert and cooperative Respiratory: Respirations even and unlabored. Lungs clear to auscultation  bilaterally.   No wheezes, crackles, or rhonchi.  Cardiovascular: Normal S1, S2. No MRG. Regular rate and rhythm. No peripheral edema, cyanosis or pallor.  Gastrointestinal:  Soft, nondistended, nontender. No rebound or guarding. Normal bowel sounds. No appreciable masses or hepatomegaly. Rectal:  Not performed.  Psychiatric: Oriented to person, place and time. Demonstrates good judgement and reason without abnormal affect or behaviors.  RELEVANT LABS AND IMAGING: CBC    Component Value Date/Time   WBC 5.8 10/20/2021 1039   WBC 17.5 (H) 10/06/2021 0324   RBC 4.56 10/20/2021 1039   RBC 3.84 (L) 10/06/2021 0324   HGB 12.6 (L) 10/20/2021 1039   HCT 39.4 10/20/2021 1039   PLT 388 10/20/2021 1039   MCV 86 10/20/2021 1039   MCH 27.6 10/20/2021 1039   MCH 28.1 10/06/2021 0324   MCHC 32.0 10/20/2021 1039   MCHC 35.6 10/06/2021 0324   RDW 14.7 10/20/2021 1039   LYMPHSABS 2.2 10/20/2021 1039   MONOABS 0.3 10/02/2021 0407   EOSABS 0.1 10/20/2021 1039   BASOSABS 0.1 10/20/2021 1039    CMP     Component Value Date/Time   NA 140 10/20/2021 1039   K 4.9 10/20/2021 1039   CL 102 10/20/2021 1039   CO2 24 10/20/2021 1039   GLUCOSE 145 (H) 10/20/2021 1039   GLUCOSE 113 (H) 10/06/2021 0324   BUN 10 10/20/2021 1039   CREATININE 0.75 (L) 10/20/2021 1039   CALCIUM 9.6 10/20/2021 1039   PROT 7.2 10/20/2021 1039   ALBUMIN 4.1 10/20/2021 1039   AST 30 10/20/2021 1039   ALT 41 10/20/2021 1039   ALKPHOS 73 10/20/2021 1039   BILITOT 1.1 10/20/2021 1039   GFRNONAA >60 10/06/2021 0324    Assessment: 1.  Elevated LFTs: LFTs have returned to normal, again more likely related to acute viral illness which has resolved  Plan: 1.  Patient does not need to follow-up with [place] any further, just as needed for any GI complaints.  Ellouise Newer, PA-C [facility] 11/03/2021, 8:54 AM  Cc: No ref. provider found

## 2021-11-03 NOTE — Patient Instructions (Signed)
It was a pleasure to see you today, glad you are doing better.  Please call us with any new GI complaints or concerns in the future.  Sincerely, Hyacinth Meeker, PA-C

## 2021-11-20 ENCOUNTER — Other Ambulatory Visit: Payer: Self-pay

## 2021-11-27 ENCOUNTER — Ambulatory Visit: Payer: Self-pay | Attending: Primary Care | Admitting: Pharmacist

## 2021-11-27 DIAGNOSIS — E119 Type 2 diabetes mellitus without complications: Secondary | ICD-10-CM

## 2021-11-27 NOTE — Progress Notes (Signed)
    S:    PCP: Gwinda Passe  No chief complaint on file.  Tom Merritt is a 31 y.o. male who presents for diabetes evaluation, education, and management. PMH is significant for recent hospitalization for NV, diarrhea found to be septic in the setting of AKI/severe dehydration, gap acidosis in July 2023. He had no prior PMH before this hospitalization. He was also found to have new onset T2DM (A1c 10.2%).  Patient was referred and last seen by Primary Care Provider, Gwinda Passe, on 10/20/2021. At last visit with CPP, patient was not checking blood glucose at home so no adjustments to regimen were made. Patient was instructed to return in 1 month with blood glucose readings so adjustments to his regimen can be made.   Today, patient arrives in good spirits and presents without any assistance. Endorses feeling tired in the mornings and that he has no energy. However, this has been occurring since before his admission in July. He also reports unilateral pain in R leg. This typically resolves after movement. Patient is not on a statin.   Patient reports Diabetes is newly diagnosed. He was unaware he had DM until he was hospitalized earlier this month. Denies any personal hx of ACS/CAD, CHF, stroke, or CKD. No personal or family hx of thyroid cancer. No hx of pancreatitis.   Current diabetes medications include: glimepiride 4 mg daily, Jardiance 25 mg daily, metformin 1000 mg BID  Patient reports adherence to taking all medications as prescribed.   Insurance coverage: None  Patient denies hypoglycemic events.  Reported home fasting blood sugars: Mostly 110-130s.   Reported 2 hour post-meal/random blood sugars: no values >153 mg/dL.  Patient denies nocturia (nighttime urination).  Patient denies neuropathy (nerve pain). Patient denies visual changes. Patient reports self foot exams.   Patient reported dietary habits: Eats 2-3 meals/day Dinner: sandwich Snacks: doesn't snack as  much  Drinks: not drinking any alcohol. Water  O:  7 day average blood glucose: 127 mg/dL 14 day average 433 mg/dL 30 day average: 295 mg/dL   Lab Results  Component Value Date   HGBA1C 10.6 (H) 10/01/2021   Lipid Panel     Component Value Date/Time   CHOL 119 10/04/2021 0422   TRIG 348 (H) 10/04/2021 0422   HDL <10 (L) 10/04/2021 0422   CHOLHDL NOT CALCULATED 10/04/2021 0422   VLDL 70 (H) 10/04/2021 0422   LDLCALC NOT CALCULATED 10/04/2021 0422    A/P: Diabetes, newly diagnosed, currently close to goal based on home blood glucose readings. Patient is able to verbalize appropriate hypoglycemia management plan. Medication adherence appears appropriate. Optimistic repeat A1c in October will be close to goal. Praised patient for improvement! No changes to regimen today.  -Continued SGLT2-I Jardiance (empagliflozin) 25 mg daily. Counseled on sick day rules. -Continued metformin 1000 mg BID.  -Continued glimepiride 4 mg daily.  -Extensively discussed pathophysiology of diabetes, recommended lifestyle interventions, dietary effects on blood sugar control.  -Counseled on s/sx of and management of hypoglycemia.  -Next A1c anticipated 10/20203.   Written patient instructions provided. Patient verbalized understanding of treatment plan.  Total time in face to face counseling 25 minutes.    Follow-up:  Pharmacist PRN. PCP clinic visit on 01/15/2022.   Valeda Malm, Pharm.D. PGY-2 Ambulatory Care Pharmacy Resident 11/27/2021 9:33 AM

## 2021-12-28 ENCOUNTER — Other Ambulatory Visit: Payer: Self-pay

## 2022-01-15 ENCOUNTER — Encounter (INDEPENDENT_AMBULATORY_CARE_PROVIDER_SITE_OTHER): Payer: Self-pay | Admitting: Primary Care

## 2022-01-15 ENCOUNTER — Ambulatory Visit (INDEPENDENT_AMBULATORY_CARE_PROVIDER_SITE_OTHER): Payer: Self-pay | Admitting: Primary Care

## 2022-01-15 VITALS — BP 135/92 | HR 100 | Temp 98.1°F | Ht 69.0 in | Wt 206.6 lb

## 2022-01-15 DIAGNOSIS — E119 Type 2 diabetes mellitus without complications: Secondary | ICD-10-CM

## 2022-01-15 DIAGNOSIS — Z23 Encounter for immunization: Secondary | ICD-10-CM

## 2022-01-15 DIAGNOSIS — Z6830 Body mass index (BMI) 30.0-30.9, adult: Secondary | ICD-10-CM

## 2022-01-15 DIAGNOSIS — E6609 Other obesity due to excess calories: Secondary | ICD-10-CM

## 2022-01-15 DIAGNOSIS — I10 Essential (primary) hypertension: Secondary | ICD-10-CM

## 2022-01-15 DIAGNOSIS — R7989 Other specified abnormal findings of blood chemistry: Secondary | ICD-10-CM

## 2022-01-15 LAB — POCT GLYCOSYLATED HEMOGLOBIN (HGB A1C): HbA1c, POC (controlled diabetic range): 4.8 % (ref 0.0–7.0)

## 2022-01-15 MED ORDER — METFORMIN HCL 500 MG PO TABS: 500.0000 mg | ORAL_TABLET | Freq: Two times a day (BID) | ORAL | 1 refills | Status: AC

## 2022-01-15 NOTE — Patient Instructions (Signed)
Calorie Counting for Weight Loss Calories are units of energy. Your body needs a certain number of calories from food to keep going throughout the day. When you eat or drink more calories than your body needs, your body stores the extra calories mostly as fat. When you eat or drink fewer calories than your body needs, your body burns fat to get the energy it needs. Calorie counting means keeping track of how many calories you eat and drink each day. Calorie counting can be helpful if you need to lose weight. If you eat fewer calories than your body needs, you should lose weight. Ask your health care provider what a healthy weight is for you. For calorie counting to work, you will need to eat the right number of calories each day to lose a healthy amount of weight per week. A dietitian can help you figure out how many calories you need in a day and will suggest ways to reach your calorie goal. A healthy amount of weight to lose each week is usually 1-2 lb (0.5-0.9 kg). This usually means that your daily calorie intake should be reduced by 500-750 calories. Eating 1,200-1,500 calories a day can help most women lose weight. Eating 1,500-1,800 calories a day can help most men lose weight. What do I need to know about calorie counting? Work with your health care provider or dietitian to determine how many calories you should get each day. To meet your daily calorie goal, you will need to: Find out how many calories are in each food that you would like to eat. Try to do this before you eat. Decide how much of the food you plan to eat. Keep a food log. Do this by writing down what you ate and how many calories it had. To successfully lose weight, it is important to balance calorie counting with a healthy lifestyle that includes regular activity. Where do I find calorie information?  The number of calories in a food can be found on a Nutrition Facts label. If a food does not have a Nutrition Facts label, try  to look up the calories online or ask your dietitian for help. Remember that calories are listed per serving. If you choose to have more than one serving of a food, you will have to multiply the calories per serving by the number of servings you plan to eat. For example, the label on a package of bread might say that a serving size is 1 slice and that there are 90 calories in a serving. If you eat 1 slice, you will have eaten 90 calories. If you eat 2 slices, you will have eaten 180 calories. How do I keep a food log? After each time that you eat, record the following in your food log as soon as possible: What you ate. Be sure to include toppings, sauces, and other extras on the food. How much you ate. This can be measured in cups, ounces, or number of items. How many calories were in each food and drink. The total number of calories in the food you ate. Keep your food log near you, such as in a pocket-sized notebook or on an app or website on your mobile phone. Some programs will calculate calories for you and show you how many calories you have left to meet your daily goal. What are some portion-control tips? Know how many calories are in a serving. This will help you know how many servings you can have of a certain   food. Use a measuring cup to measure serving sizes. You could also try weighing out portions on a kitchen scale. With time, you will be able to estimate serving sizes for some foods. Take time to put servings of different foods on your favorite plates or in your favorite bowls and cups so you know what a serving looks like. Try not to eat straight from a food's packaging, such as from a bag or box. Eating straight from the package makes it hard to see how much you are eating and can lead to overeating. Put the amount you would like to eat in a cup or on a plate to make sure you are eating the right portion. Use smaller plates, glasses, and bowls for smaller portions and to prevent  overeating. Try not to multitask. For example, avoid watching TV or using your computer while eating. If it is time to eat, sit down at a table and enjoy your food. This will help you recognize when you are full. It will also help you be more mindful of what and how much you are eating. What are tips for following this plan? Reading food labels Check the calorie count compared with the serving size. The serving size may be smaller than what you are used to eating. Check the source of the calories. Try to choose foods that are high in protein, fiber, and vitamins, and low in saturated fat, trans fat, and sodium. Shopping Read nutrition labels while you shop. This will help you make healthy decisions about which foods to buy. Pay attention to nutrition labels for low-fat or fat-free foods. These foods sometimes have the same number of calories or more calories than the full-fat versions. They also often have added sugar, starch, or salt to make up for flavor that was removed with the fat. Make a grocery list of lower-calorie foods and stick to it. Cooking Try to cook your favorite foods in a healthier way. For example, try baking instead of frying. Use low-fat dairy products. Meal planning Use more fruits and vegetables. One-half of your plate should be fruits and vegetables. Include lean proteins, such as chicken, turkey, and fish. Lifestyle Each week, aim to do one of the following: 150 minutes of moderate exercise, such as walking. 75 minutes of vigorous exercise, such as running. General information Know how many calories are in the foods you eat most often. This will help you calculate calorie counts faster. Find a way of tracking calories that works for you. Get creative. Try different apps or programs if writing down calories does not work for you. What foods should I eat?  Eat nutritious foods. It is better to have a nutritious, high-calorie food, such as an avocado, than a food with  few nutrients, such as a bag of potato chips. Use your calories on foods and drinks that will fill you up and will not leave you hungry soon after eating. Examples of foods that fill you up are nuts and nut butters, vegetables, lean proteins, and high-fiber foods such as whole grains. High-fiber foods are foods with more than 5 g of fiber per serving. Pay attention to calories in drinks. Low-calorie drinks include water and unsweetened drinks. The items listed above may not be a complete list of foods and beverages you can eat. Contact a dietitian for more information. What foods should I limit? Limit foods or drinks that are not good sources of vitamins, minerals, or protein or that are high in unhealthy fats. These   include: Candy. Other sweets. Sodas, specialty coffee drinks, alcohol, and juice. The items listed above may not be a complete list of foods and beverages you should avoid. Contact a dietitian for more information. How do I count calories when eating out? Pay attention to portions. Often, portions are much larger when eating out. Try these tips to keep portions smaller: Consider sharing a meal instead of getting your own. If you get your own meal, eat only half of it. Before you start eating, ask for a container and put half of your meal into it. When available, consider ordering smaller portions from the menu instead of full portions. Pay attention to your food and drink choices. Knowing the way food is cooked and what is included with the meal can help you eat fewer calories. If calories are listed on the menu, choose the lower-calorie options. Choose dishes that include vegetables, fruits, whole grains, low-fat dairy products, and lean proteins. Choose items that are boiled, broiled, grilled, or steamed. Avoid items that are buttered, battered, fried, or served with cream sauce. Items labeled as crispy are usually fried, unless stated otherwise. Choose water, low-fat milk,  unsweetened iced tea, or other drinks without added sugar. If you want an alcoholic beverage, choose a lower-calorie option, such as a glass of wine or light beer. Ask for dressings, sauces, and syrups on the side. These are usually high in calories, so you should limit the amount you eat. If you want a salad, choose a garden salad and ask for grilled meats. Avoid extra toppings such as bacon, cheese, or fried items. Ask for the dressing on the side, or ask for olive oil and vinegar or lemon to use as dressing. Estimate how many servings of a food you are given. Knowing serving sizes will help you be aware of how much food you are eating at restaurants. Where to find more information Centers for Disease Control and Prevention: www.cdc.gov U.S. Department of Agriculture: myplate.gov Summary Calorie counting means keeping track of how many calories you eat and drink each day. If you eat fewer calories than your body needs, you should lose weight. A healthy amount of weight to lose per week is usually 1-2 lb (0.5-0.9 kg). This usually means reducing your daily calorie intake by 500-750 calories. The number of calories in a food can be found on a Nutrition Facts label. If a food does not have a Nutrition Facts label, try to look up the calories online or ask your dietitian for help. Use smaller plates, glasses, and bowls for smaller portions and to prevent overeating. Use your calories on foods and drinks that will fill you up and not leave you hungry shortly after a meal. This information is not intended to replace advice given to you by your health care provider. Make sure you discuss any questions you have with your health care provider. Document Revised: 04/30/2019 Document Reviewed: 04/30/2019 Elsevier Patient Education  2023 Elsevier Inc.  

## 2022-01-15 NOTE — Progress Notes (Unsigned)
Pain and numbness in right thigh.

## 2022-01-15 NOTE — Progress Notes (Unsigned)
Tom Merritt, is a 31 y.o. male  TKZ:[id number]  TDD:[id number]  DOB - 03-22-1991  Chief Complaint  Patient presents with   Diabetes       Subjective:   Tom Merritt is a 31 y.o. obese male here today for a follow up visit for the management of hypertension. Patient has No headache, No chest pain, No abdominal pain - No Nausea, No new weakness tingling or numbness, No Cough - shortness of breath.  Also type 2 diabetes he denies polyuria, polydipsia, polyphagia or vision changes. He does have a concern with right side lower back and thigh continues to cause pain.  Ranges from 0-6 out of the scale of 10.  This has been an issue since he has been discharged from the hospital.  No problems updated.  No Known Allergies  Past Medical History:  Diagnosis Date   Congenital abnormality of ear-right     Current Outpatient Medications on File Prior to Visit  Medication Sig Dispense Refill   blood glucose meter kit and supplies KIT Dispense based on patient and insurance preference. Use up to four times daily as directed. 1 each 0   empagliflozin (JARDIANCE) 25 MG TABS tablet Take 1 tablet (25 mg total) by mouth daily before breakfast. 90 tablet 1   fenofibrate 160 MG tablet Take 1 tablet (160 mg total) by mouth daily. 30 tablet 1   ferrous sulfate 325 (65 FE) MG tablet Take 1 tablet (325 mg total) by mouth daily with breakfast. 30 tablet 1   lisinopril (ZESTRIL) 5 MG tablet Take 1 tablet (5 mg total) by mouth daily. 90 tablet 3   potassium chloride SA (KLOR-CON M) 20 MEQ tablet Take 2 tablets (40 mEq total) by mouth daily for 7 days. 14 tablet 0   No current facility-administered medications on file prior to visit.    Objective:   Vitals:   01/15/22 0939  BP: (!) 135/92  Pulse: 100  Temp: 98.1 F (36.7 C)  TempSrc: Oral  SpO2: 98%  Weight: 206 lb 9.6 oz (93.7 kg)  Height: $Remove'5\' 9"'PMMblrr$  (1.753 m)    Exam General appearance : Awake, alert,  not in any distress. Speech Clear. Not toxic looking HEENT: Atraumatic and Normocephalic, pupils equally reactive to light and accomodation Neck: Supple, no JVD. No cervical lymphadenopathy.  Chest: Tom air entry bilaterally, no added sounds  CVS: S1 S2 regular, no murmurs.  Abdomen: Bowel sounds present, Non tender and not distended with no gaurding, rigidity or rebound. Extremities: B/L Lower Ext shows no edema, both legs are warm to touch Neurology: Awake alert, and oriented X 3,  Non focal Skin: No Rash  Data Review Lab Results  Component Value Date   HGBA1C 4.8 01/15/2022   HGBA1C 10.6 (H) 10/01/2021    Assessment & Plan   1. Type 2 diabetes mellitus without complication, without long-term current use of insulin (HCC) - POCT glycosylated hemoglobin (Hb A1C) 4.6.  Improvement below ADA guidelines for management of diabetes. - educated on lifestyle modifications, including but not limited to diet choices and adding exercise to daily routine.  Medication changes discontinue Amaryl decrease metformin to 500 mg twice daily.  Discussed signs and symptoms of hypoglycemia.  2. Need for prophylactic vaccination and inoculation against influenza completed  3. Class 1 obesity due to excess calories without serious comorbidity with body mass index (BMI) of 30.0 to 30.9 in adult Obesity is 30-39 indicating an excess in caloric intake or underlining conditions. This  may lead to other co-morbidities. Educated on lifestyle modifications of diet and exercise which may reduce obesity.    4. Abnormal LFTs cmp  5. Essential hypertension BP goal - < 130/80 patient is fasting no medications taken prior to appointment Explained that having normal blood pressure is the goal and medications are helping to get to goal and maintain normal blood pressure. DIET: Limit salt intake, read nutrition labels to check salt content, limit fried and high fatty foods  Avoid using multisymptom OTC cold  preparations that generally contain sudafed which can rise BP. Consult with pharmacist on best cold relief products to use for persons with HTN EXERCISE Discussed incorporating exercise such as walking - 30 minutes most days of the week and can do in 10 minute intervals     Patient have been counseled extensively about nutrition and exercise. Other issues discussed during this visit include: low cholesterol diet, weight control and daily exercise, foot care, annual eye examinations at Ophthalmology, importance of adherence with medications and regular follow-up. We also discussed long term complications of uncontrolled diabetes and hypertension.   Return in about 3 months (around 04/17/2022) for HTN/DM.  The patient was given clear instructions to go to ER or return to medical center if symptoms don't improve, worsen or new problems develop. The patient verbalized understanding. The patient was told to call to get lab results if they haven't heard anything in the next week.   This note has been created with Surveyor, quantity. Any transcriptional errors are unintentional.   Kerin Perna, NP 01/16/2022, 5:33 PM

## 2022-01-16 LAB — COMPREHENSIVE METABOLIC PANEL
ALT: 41 IU/L (ref 0–44)
AST: 19 IU/L (ref 0–40)
Albumin/Globulin Ratio: 1.8 (ref 1.2–2.2)
Albumin: 5.2 g/dL — ABNORMAL HIGH (ref 4.1–5.1)
Alkaline Phosphatase: 84 IU/L (ref 44–121)
BUN/Creatinine Ratio: 17 (ref 9–20)
BUN: 14 mg/dL (ref 6–20)
Bilirubin Total: 0.7 mg/dL (ref 0.0–1.2)
CO2: 23 mmol/L (ref 20–29)
Calcium: 10.1 mg/dL (ref 8.7–10.2)
Chloride: 98 mmol/L (ref 96–106)
Creatinine, Ser: 0.83 mg/dL (ref 0.76–1.27)
Globulin, Total: 2.9 g/dL (ref 1.5–4.5)
Glucose: 98 mg/dL (ref 70–99)
Potassium: 4.9 mmol/L (ref 3.5–5.2)
Sodium: 139 mmol/L (ref 134–144)
Total Protein: 8.1 g/dL (ref 6.0–8.5)
eGFR: 119 mL/min/{1.73_m2} (ref >59)

## 2022-01-16 LAB — LIPID PANEL
Chol/HDL Ratio: 2.5 ratio (ref 0.0–5.0)
Cholesterol, Total: 125 mg/dL (ref 100–199)
HDL: 51 mg/dL (ref >39)
LDL Chol Calc (NIH): 57 mg/dL (ref 0–99)
Triglycerides: 85 mg/dL (ref 0–149)
VLDL Cholesterol Cal: 17 mg/dL (ref 5–40)

## 2022-01-16 LAB — MICROALBUMIN, URINE: Microalbumin, Urine: 15.5 ug/mL

## 2022-01-18 ENCOUNTER — Telehealth (INDEPENDENT_AMBULATORY_CARE_PROVIDER_SITE_OTHER): Payer: Self-pay

## 2022-01-18 NOTE — Telephone Encounter (Signed)
Contacted pt to go over lab results pt is aware and doesn't have any questions or concerns 

## 2022-02-08 ENCOUNTER — Other Ambulatory Visit: Payer: Self-pay

## 2022-02-08 ENCOUNTER — Other Ambulatory Visit (INDEPENDENT_AMBULATORY_CARE_PROVIDER_SITE_OTHER): Payer: Self-pay | Admitting: Primary Care

## 2022-02-08 NOTE — Telephone Encounter (Signed)
Rxs were dc'd on 01/15/22 d/t improvement in A1C DM. New rx for metformin was sent in as well on 01/15/22 #180/1.  Requested Prescriptions  Refused Prescriptions Disp Refills   metFORMIN (GLUCOPHAGE) 1000 MG tablet 180 tablet 3    Sig: Take 1 tablet (1,000 mg total) by mouth 2 (two) times daily with a meal.     Endocrinology:  Diabetes - Biguanides Failed - 02/08/2022  9:11 AM      Failed - B12 Level in normal range and within 720 days    Vitamin B-12  Date Value Ref Range Status  10/03/2021 4,073 (H) 180 - 914 pg/mL Final    Comment:    RESULT CONFIRMED BY AUTOMATED DILUTION (NOTE) This assay is not validated for testing neonatal or myeloproliferative syndrome specimens for Vitamin B12 levels. Performed at [facility], [place] [address], [place], [place] [address]          Passed - Cr in normal range and within 360 days    Creatinine, Ser  Date Value Ref Range Status  01/15/2022 0.83 0.76 - 1.27 mg/dL Final   Creatinine, Urine  Date Value Ref Range Status  10/01/2021 94.74 mg/dL Final    Comment:    Performed at [facility] [address] [place], [place] [address]         Passed - HBA1C is between 0 and 7.9 and within 180 days    HbA1c, POC (controlled diabetic range)  Date Value Ref Range Status  01/15/2022 4.8 0.0 - 7.0 % Final         Passed - eGFR in normal range and within 360 days    GFR, Estimated  Date Value Ref Range Status  10/06/2021 >60 >60 mL/min Final    Comment:    (NOTE) Calculated using the CKD-EPI Creatinine Equation (2021)    eGFR  Date Value Ref Range Status  01/15/2022 119 >59 mL/min/1.73 Final         Passed - Valid encounter within last 6 months    Recent Outpatient Visits           3 weeks ago Type 2 diabetes mellitus without complication, without long-term current use of insulin ([place])   [place] [facility] Kerin Perna, NP   2 months ago Type 2 diabetes mellitus without complication, without  long-term current use of insulin ([place])   [place], Annie Main L, RPH-CPP   3 months ago Type 2 diabetes mellitus without complication, without long-term current use of insulin ([place])   [place], Annie Main L, RPH-CPP   3 months ago Need for Tdap vaccination   [place] Kerin Perna, NP              Passed - CBC within normal limits and completed in the last 12 months    WBC  Date Value Ref Range Status  10/20/2021 5.8 3.4 - 10.8 x10E3/uL Final  10/06/2021 17.5 (H) 4.0 - 10.5 K/uL Final   RBC  Date Value Ref Range Status  10/20/2021 4.56 4.14 - 5.80 x10E6/uL Final  10/06/2021 3.84 (L) 4.22 - 5.81 MIL/uL Final   Hemoglobin  Date Value Ref Range Status  10/20/2021 12.6 (L) 13.0 - 17.7 g/dL Final   Hematocrit  Date Value Ref Range Status  10/20/2021 39.4 37.5 - 51.0 % Final   MCHC  Date Value Ref Range Status  10/20/2021  32.0 31.5 - 35.7 g/dL Final  10/06/2021 35.6 30.0 - 36.0 g/dL Final   [facility]  Date Value Ref Range Status  10/20/2021 27.6 26.6 - 33.0 pg Final  10/06/2021 28.1 26.0 - 34.0 pg Final   MCV  Date Value Ref Range Status  10/20/2021 86 79 - 97 fL Final   No results found for: "PLTCOUNTKUC", "LABPLAT", "POCPLA" RDW  Date Value Ref Range Status  10/20/2021 14.7 11.6 - 15.4 % Final          glimepiride (AMARYL) 4 MG tablet 90 tablet 1    Sig: Take 1 tablet (4 mg total) by mouth daily before breakfast.     Endocrinology:  Diabetes - Sulfonylureas Passed - 02/08/2022  9:11 AM      Passed - HBA1C is between 0 and 7.9 and within 180 days    HbA1c, POC (controlled diabetic range)  Date Value Ref Range Status  01/15/2022 4.8 0.0 - 7.0 % Final         Passed - Cr in normal range and within 360 days    Creatinine, Ser  Date Value Ref Range Status  01/15/2022 0.83 0.76 - 1.27 mg/dL Final   Creatinine, Urine  Date Value Ref Range Status   10/01/2021 94.74 mg/dL Final    Comment:    Performed at [facility], [place] [address] [place], [place] [address]         Passed - Valid encounter within last 6 months    Recent Outpatient Visits           3 weeks ago Type 2 diabetes mellitus without complication, without long-term current use of insulin ([place])   [place] [facility] Kerin Perna, NP   2 months ago Type 2 diabetes mellitus without complication, without long-term current use of insulin [facility])   [place], Annie Main L, RPH-CPP   3 months ago Type 2 diabetes mellitus without complication, without long-term current use of insulin [facility])   [place], Jarome Matin, RPH-CPP   3 months ago Need for Tdap vaccination   [place] Kerin Perna, NP

## 2022-02-12 ENCOUNTER — Other Ambulatory Visit: Payer: Self-pay

## 2022-07-12 ENCOUNTER — Other Ambulatory Visit (HOSPITAL_COMMUNITY): Payer: Self-pay
